# Patient Record
Sex: Male | Born: 1935 | Race: White | Hispanic: No | Smoking: Former smoker
Health system: Southern US, Community
[De-identification: ages and names within clinical notes are randomized; demographics above are authoritative.]

## PROBLEM LIST (undated history)

## (undated) DIAGNOSIS — Z992 Dependence on renal dialysis: Secondary | ICD-10-CM

## (undated) DIAGNOSIS — I739 Peripheral vascular disease, unspecified: Secondary | ICD-10-CM

## (undated) DIAGNOSIS — I10 Essential (primary) hypertension: Secondary | ICD-10-CM

## (undated) DIAGNOSIS — N189 Chronic kidney disease, unspecified: Secondary | ICD-10-CM

## (undated) DIAGNOSIS — C801 Malignant (primary) neoplasm, unspecified: Secondary | ICD-10-CM

## (undated) DIAGNOSIS — I714 Abdominal aortic aneurysm, without rupture: Secondary | ICD-10-CM

## (undated) DIAGNOSIS — E785 Hyperlipidemia, unspecified: Secondary | ICD-10-CM

## (undated) DIAGNOSIS — Z86718 Personal history of other venous thrombosis and embolism: Secondary | ICD-10-CM

## (undated) DIAGNOSIS — M1990 Unspecified osteoarthritis, unspecified site: Secondary | ICD-10-CM

## (undated) HISTORY — DX: Hyperlipidemia, unspecified: E78.5

## (undated) HISTORY — DX: Essential (primary) hypertension: I10

## (undated) HISTORY — DX: Chronic kidney disease, unspecified: N18.9

## (undated) HISTORY — PX: THROMBECTOMY / ARTERIOVENOUS GRAFT REVISION: SUR1351

## (undated) HISTORY — DX: Peripheral vascular disease, unspecified: I73.9

## (undated) HISTORY — DX: Personal history of other venous thrombosis and embolism: Z86.718

## (undated) HISTORY — DX: Malignant (primary) neoplasm, unspecified: C80.1

## (undated) HISTORY — DX: Abdominal aortic aneurysm, without rupture: I71.4

## (undated) HISTORY — DX: Unspecified osteoarthritis, unspecified site: M19.90

---

## 2007-10-02 DIAGNOSIS — I714 Abdominal aortic aneurysm, without rupture, unspecified: Secondary | ICD-10-CM

## 2007-10-02 HISTORY — DX: Abdominal aortic aneurysm, without rupture, unspecified: I71.40

## 2007-10-02 HISTORY — DX: Abdominal aortic aneurysm, without rupture: I71.4

## 2008-03-30 ENCOUNTER — Inpatient Hospital Stay (HOSPITAL_COMMUNITY): Admission: EM | Admit: 2008-03-30 | Discharge: 2008-04-02 | Payer: Self-pay | Admitting: Emergency Medicine

## 2008-03-30 ENCOUNTER — Ambulatory Visit: Payer: Self-pay | Admitting: Surgery

## 2008-03-30 HISTORY — PX: ABDOMINAL AORTIC ANEURYSM REPAIR: SHX42

## 2008-04-12 ENCOUNTER — Ambulatory Visit: Payer: Self-pay | Admitting: Surgery

## 2008-04-19 ENCOUNTER — Encounter: Admission: RE | Admit: 2008-04-19 | Discharge: 2008-04-19 | Payer: Self-pay | Admitting: Surgery

## 2008-04-19 ENCOUNTER — Ambulatory Visit: Payer: Self-pay | Admitting: Surgery

## 2008-05-12 ENCOUNTER — Inpatient Hospital Stay (HOSPITAL_COMMUNITY): Admission: EM | Admit: 2008-05-12 | Discharge: 2008-05-17 | Payer: Self-pay | Admitting: Emergency Medicine

## 2008-05-27 ENCOUNTER — Ambulatory Visit: Payer: Self-pay | Admitting: Surgery

## 2008-06-14 ENCOUNTER — Ambulatory Visit: Payer: Self-pay | Admitting: Surgery

## 2008-06-18 ENCOUNTER — Ambulatory Visit (HOSPITAL_COMMUNITY): Admission: RE | Admit: 2008-06-18 | Discharge: 2008-06-18 | Payer: Self-pay | Admitting: Surgery

## 2008-06-23 ENCOUNTER — Ambulatory Visit: Payer: Self-pay | Admitting: Vascular Surgery

## 2008-06-23 ENCOUNTER — Ambulatory Visit (HOSPITAL_COMMUNITY): Admission: RE | Admit: 2008-06-23 | Discharge: 2008-06-23 | Payer: Self-pay | Admitting: Surgery

## 2008-07-12 ENCOUNTER — Ambulatory Visit: Payer: Self-pay | Admitting: Surgery

## 2008-07-13 ENCOUNTER — Ambulatory Visit (HOSPITAL_COMMUNITY): Admission: RE | Admit: 2008-07-13 | Discharge: 2008-07-13 | Payer: Self-pay | Admitting: Surgery

## 2008-07-19 ENCOUNTER — Encounter: Admission: RE | Admit: 2008-07-19 | Discharge: 2008-07-19 | Payer: Self-pay | Admitting: Surgery

## 2008-07-19 ENCOUNTER — Ambulatory Visit: Payer: Self-pay | Admitting: Surgery

## 2008-08-04 ENCOUNTER — Encounter: Admission: RE | Admit: 2008-08-04 | Discharge: 2008-08-04 | Payer: Self-pay | Admitting: Interventional Cardiology

## 2008-08-06 ENCOUNTER — Inpatient Hospital Stay (HOSPITAL_BASED_OUTPATIENT_CLINIC_OR_DEPARTMENT_OTHER): Admission: RE | Admit: 2008-08-06 | Discharge: 2008-08-06 | Payer: Self-pay | Admitting: Interventional Cardiology

## 2008-08-18 ENCOUNTER — Ambulatory Visit: Payer: Self-pay | Admitting: Vascular Surgery

## 2008-08-18 ENCOUNTER — Ambulatory Visit (HOSPITAL_COMMUNITY): Admission: RE | Admit: 2008-08-18 | Discharge: 2008-08-18 | Payer: Self-pay | Admitting: Surgery

## 2008-09-13 ENCOUNTER — Ambulatory Visit: Payer: Self-pay | Admitting: Surgery

## 2008-10-10 ENCOUNTER — Ambulatory Visit: Payer: Self-pay | Admitting: Vascular Surgery

## 2008-10-10 ENCOUNTER — Ambulatory Visit (HOSPITAL_COMMUNITY): Admission: EM | Admit: 2008-10-10 | Discharge: 2008-10-10 | Payer: Self-pay | Admitting: *Deleted

## 2008-10-25 ENCOUNTER — Encounter: Admission: RE | Admit: 2008-10-25 | Discharge: 2008-10-25 | Payer: Self-pay | Admitting: Surgery

## 2008-10-25 ENCOUNTER — Ambulatory Visit: Payer: Self-pay | Admitting: Surgery

## 2009-01-31 ENCOUNTER — Encounter: Admission: RE | Admit: 2009-01-31 | Discharge: 2009-01-31 | Payer: Self-pay | Admitting: Gastroenterology

## 2009-04-25 ENCOUNTER — Ambulatory Visit: Payer: Self-pay | Admitting: Surgery

## 2009-04-25 ENCOUNTER — Encounter: Admission: RE | Admit: 2009-04-25 | Discharge: 2009-04-25 | Payer: Self-pay | Admitting: Surgery

## 2009-07-17 ENCOUNTER — Ambulatory Visit (HOSPITAL_COMMUNITY): Admission: RE | Admit: 2009-07-17 | Discharge: 2009-07-17 | Payer: Self-pay | Admitting: Nephrology

## 2009-07-24 IMAGING — CR DG FOOT COMPLETE 3+V*R*
3 series · 3 of 3 positions shown · non-contrast
Comparison: None

CLINICAL DATA: Fall, pain great toe

RIGHT FOOT COMPLETE - 3+ VIEW

[AP]
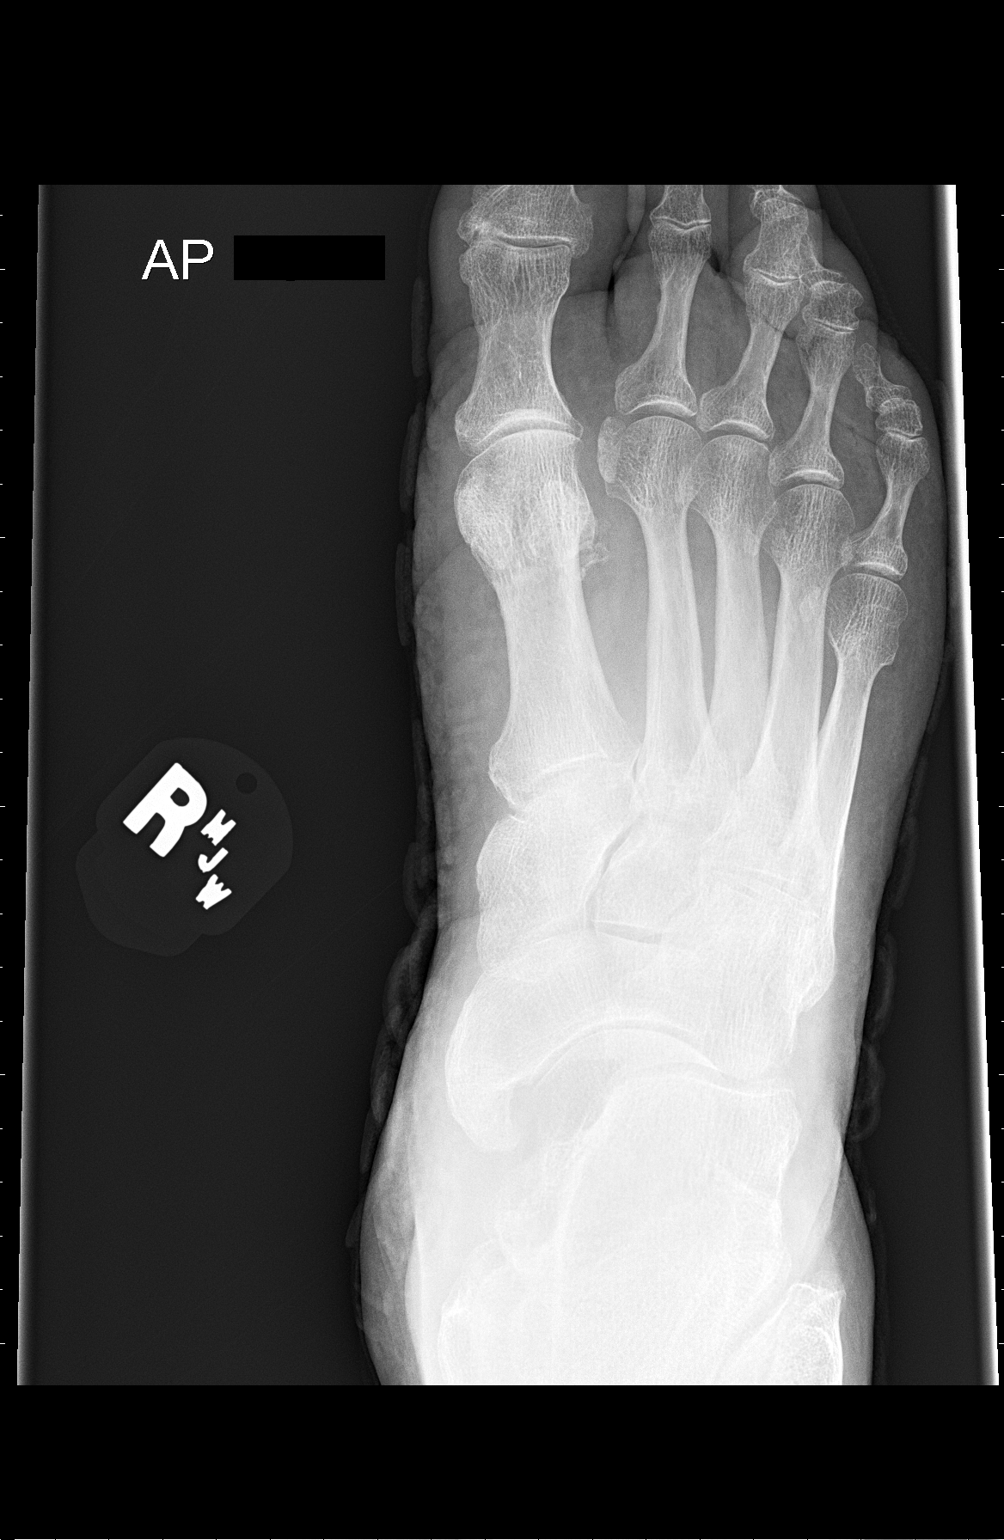

[foot obl]
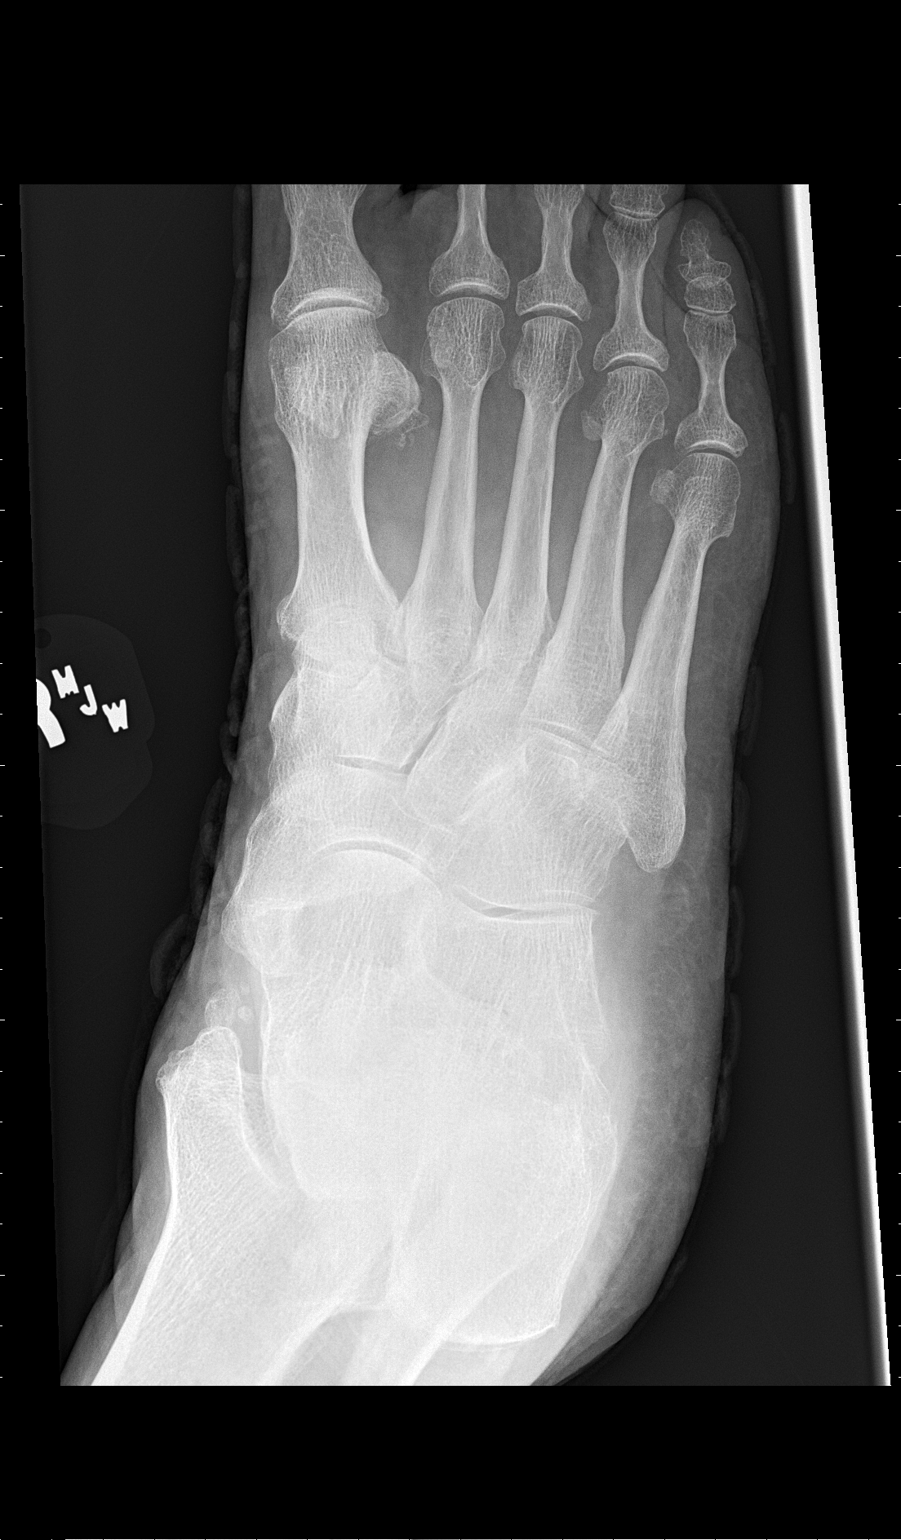

[foot lat]
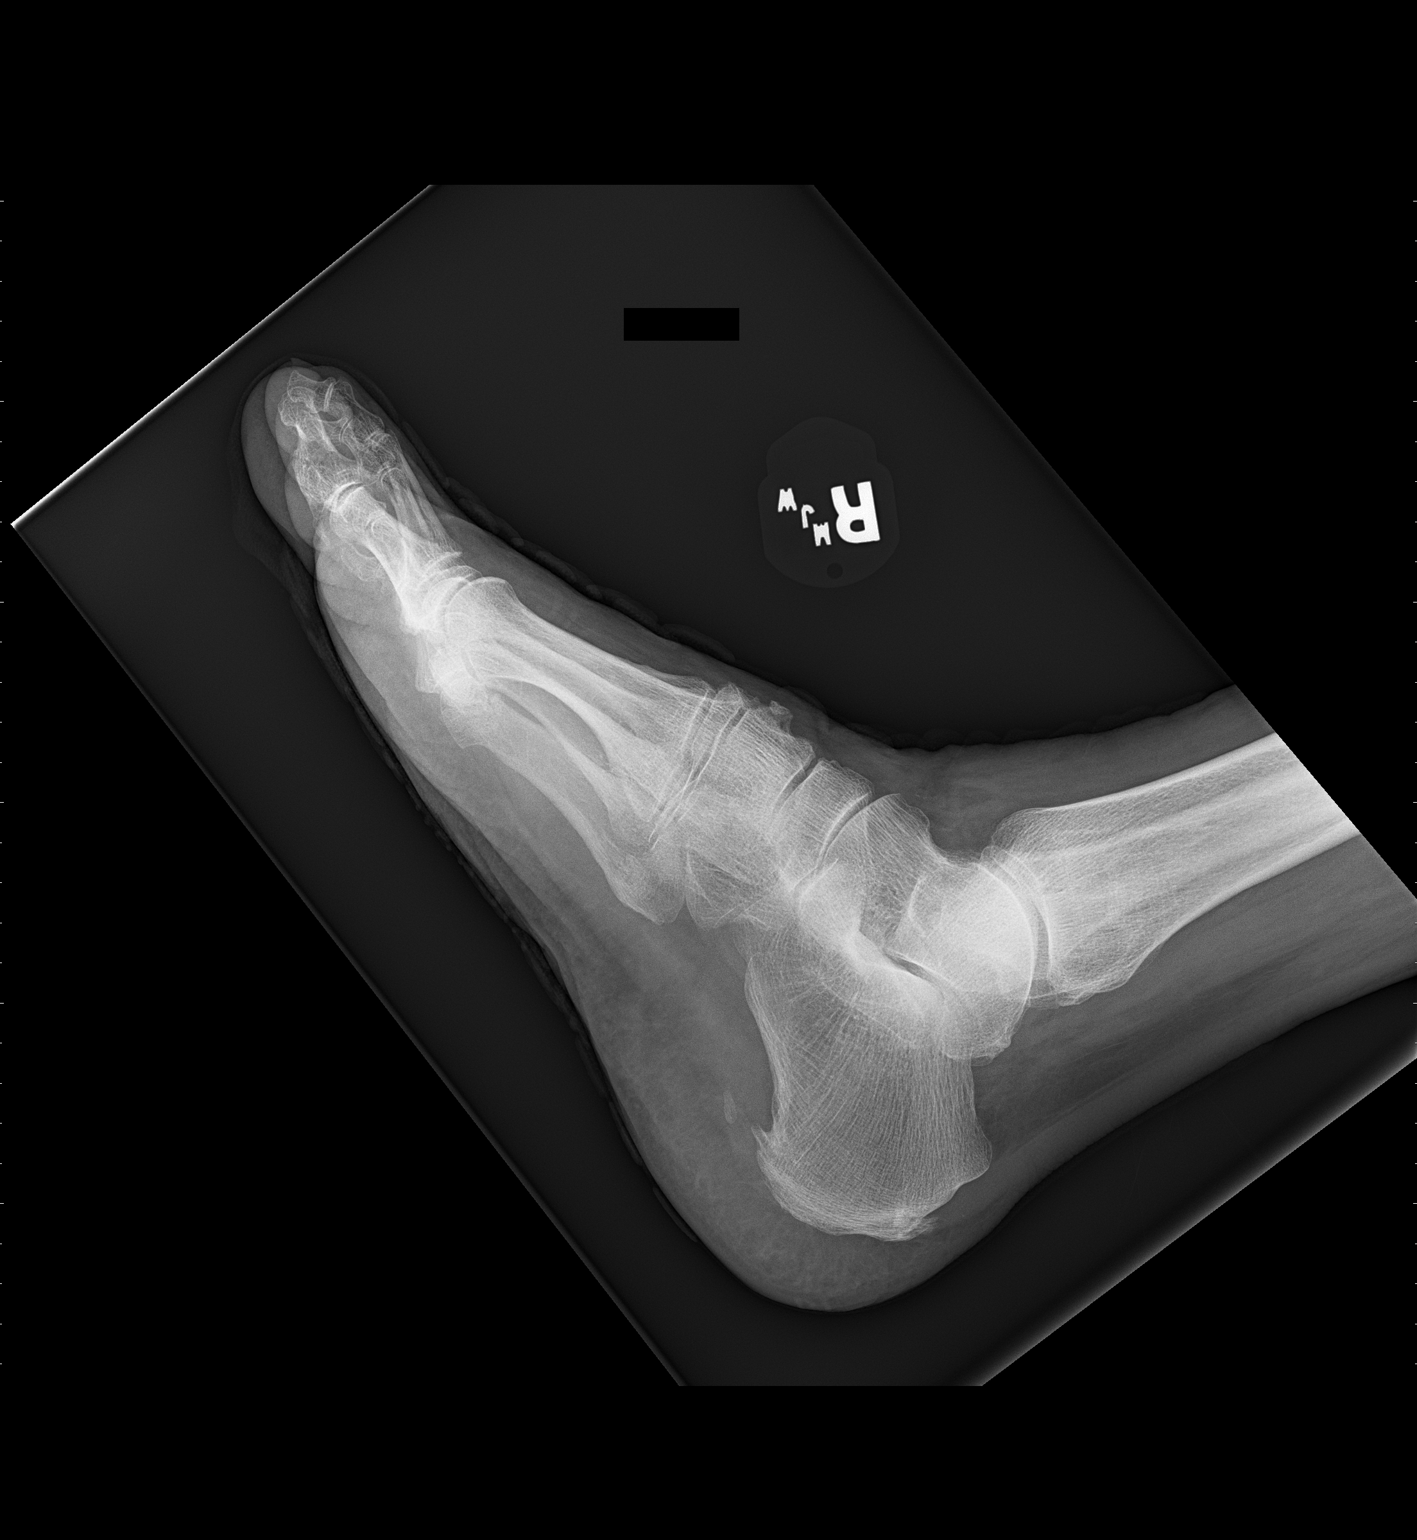

[3 of 3 positions shown; findings below may reference images not displayed]

FINDINGS: Negative for fracture or dislocation.  No erosions are
identified.  There is mild degenerative change in the first MTP and
first interphalangeal joints. There is degenerative spurring
involving the calcaneus.
IMPRESSION: No acute bony abnormality and negative for fracture.

## 2009-07-24 IMAGING — CR DG CHEST 1V PORT
1 series · 1 of 1 positions shown · non-contrast
Comparison: 03/30/2008.

CLINICAL DATA: Abdominal aortic aneurysm.

PORTABLE CHEST - 1 VIEW

[AP]
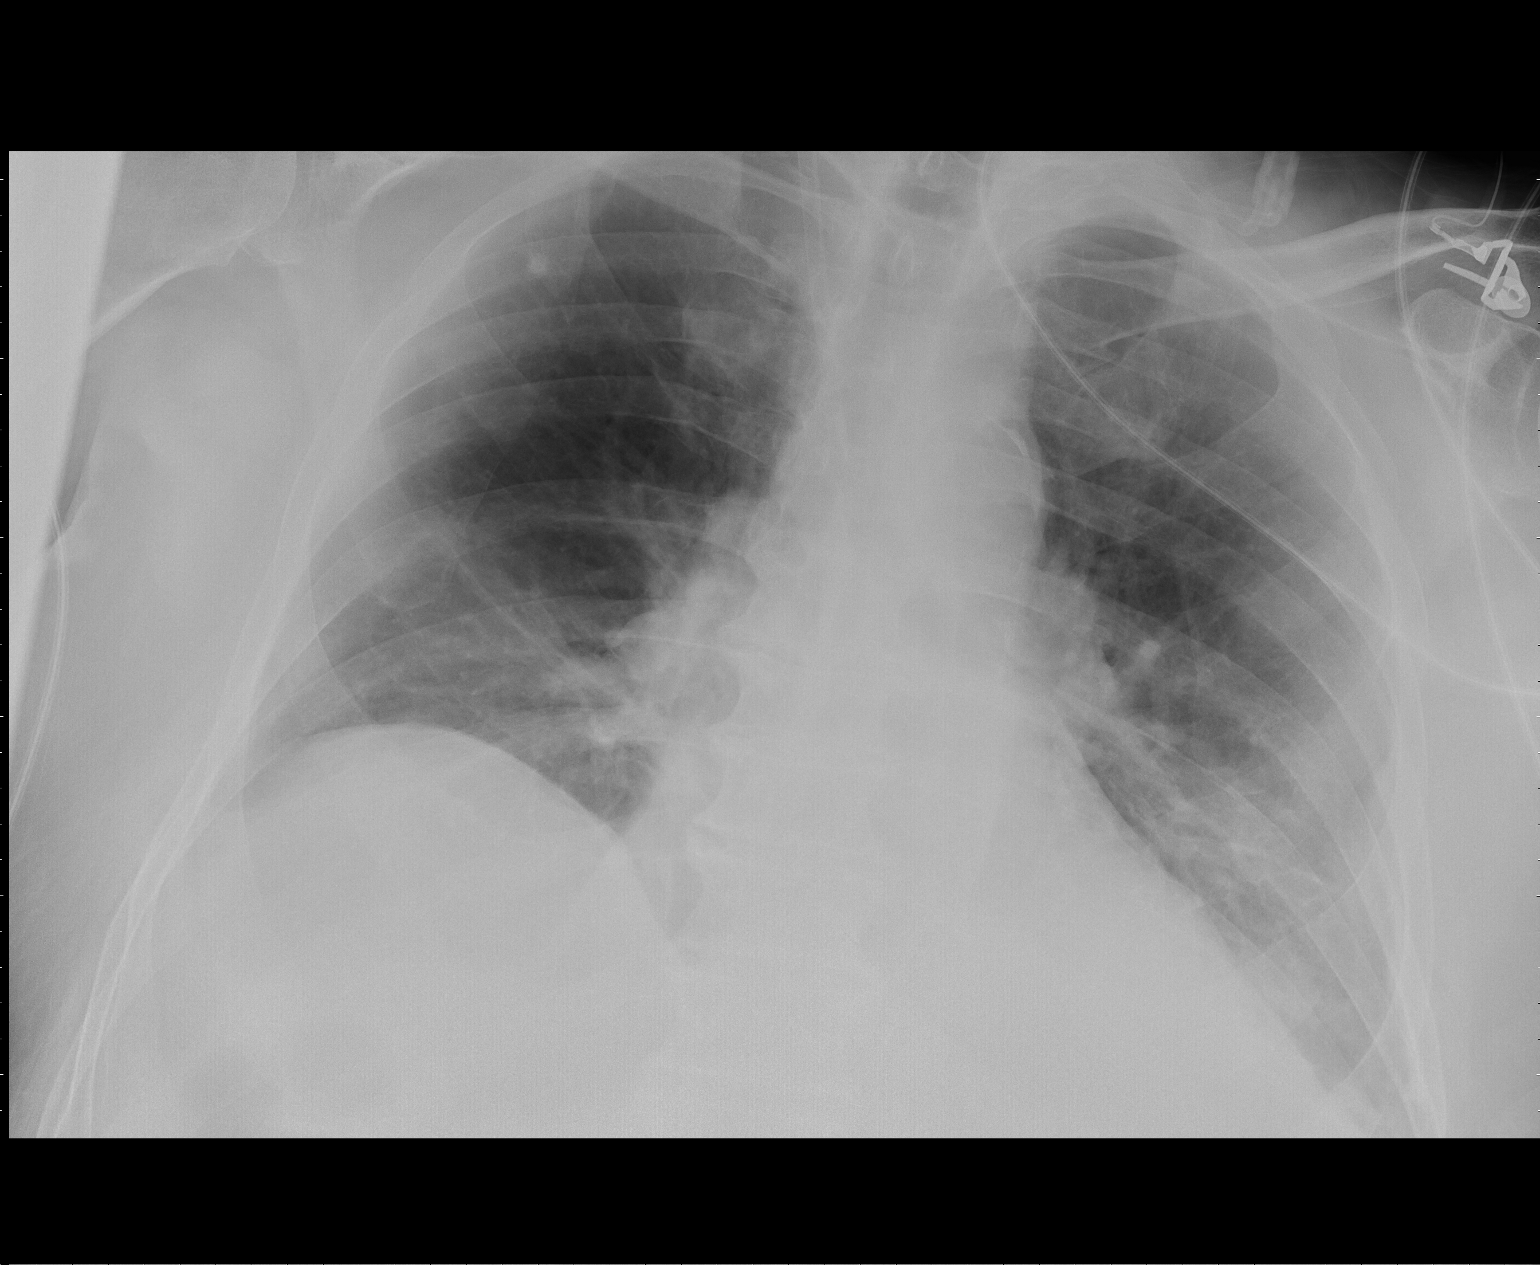

[1 of 1 positions shown; findings below may reference images not displayed]

FINDINGS: The Swan-Ganz catheter has been removed.  The right IJ
sheath remains.  Lung volumes are decreased.  There is significant
interval increase in left basilar airspace disease.  Minimal right
basilar atelectasis is noted.  The upper lung fields are stable.
IMPRESSION: 1.  Status post removal of Swan-Ganz catheter.
2.  Interval development of left basilar airspace disease.  While
this most likely represents atelectasis, early infection is not
excluded.
3.  Minimal right basilar atelectasis.

## 2009-10-19 ENCOUNTER — Ambulatory Visit: Payer: Self-pay | Admitting: Surgery

## 2009-10-21 ENCOUNTER — Ambulatory Visit: Payer: Self-pay | Admitting: Surgery

## 2009-10-21 ENCOUNTER — Emergency Department (HOSPITAL_COMMUNITY): Admission: EM | Admit: 2009-10-21 | Discharge: 2009-10-21 | Payer: Self-pay | Admitting: Emergency Medicine

## 2010-04-24 ENCOUNTER — Ambulatory Visit: Payer: Self-pay | Admitting: Surgery

## 2010-04-24 ENCOUNTER — Encounter: Admission: RE | Admit: 2010-04-24 | Discharge: 2010-04-24 | Payer: Self-pay | Admitting: Surgery

## 2010-10-22 ENCOUNTER — Encounter: Payer: Self-pay | Admitting: Internal Medicine

## 2010-10-22 ENCOUNTER — Encounter: Payer: Self-pay | Admitting: Surgery

## 2010-10-22 ENCOUNTER — Encounter: Payer: Self-pay | Admitting: Nephrology

## 2010-10-23 ENCOUNTER — Ambulatory Visit: Admit: 2010-10-23 | Payer: Self-pay | Admitting: Surgery

## 2010-12-17 LAB — POCT I-STAT, CHEM 8
Chloride: 99 mEq/L (ref 96–112)
HCT: 36 % — ABNORMAL LOW (ref 39.0–52.0)
Hemoglobin: 12.2 g/dL — ABNORMAL LOW (ref 13.0–17.0)
Potassium: 3.9 mEq/L (ref 3.5–5.1)
Sodium: 132 mEq/L — ABNORMAL LOW (ref 135–145)

## 2010-12-17 LAB — DIFFERENTIAL
Basophils Absolute: 0 10*3/uL (ref 0.0–0.1)
Lymphocytes Relative: 11 % — ABNORMAL LOW (ref 12–46)
Lymphs Abs: 1.1 10*3/uL (ref 0.7–4.0)
Neutro Abs: 7.7 10*3/uL (ref 1.7–7.7)

## 2010-12-17 LAB — CBC
Hemoglobin: 12.4 g/dL — ABNORMAL LOW (ref 13.0–17.0)
Platelets: 221 10*3/uL (ref 150–400)
RDW: 13.1 % (ref 11.5–15.5)
WBC: 10 10*3/uL (ref 4.0–10.5)

## 2010-12-17 LAB — PROTIME-INR: Prothrombin Time: 23.2 seconds — ABNORMAL HIGH (ref 11.6–15.2)

## 2011-01-04 LAB — POTASSIUM: Potassium: 4 mEq/L (ref 3.5–5.1)

## 2011-01-11 ENCOUNTER — Other Ambulatory Visit (HOSPITAL_COMMUNITY): Payer: Self-pay | Admitting: Nephrology

## 2011-01-11 DIAGNOSIS — N186 End stage renal disease: Secondary | ICD-10-CM

## 2011-01-15 LAB — POCT I-STAT, CHEM 8
HCT: 44 % (ref 39.0–52.0)
Hemoglobin: 15 g/dL (ref 13.0–17.0)
Potassium: 4.5 mEq/L (ref 3.5–5.1)
Sodium: 137 mEq/L (ref 135–145)

## 2011-01-24 ENCOUNTER — Ambulatory Visit (HOSPITAL_COMMUNITY): Admission: RE | Admit: 2011-01-24 | Payer: Self-pay | Source: Ambulatory Visit

## 2011-01-26 ENCOUNTER — Other Ambulatory Visit (HOSPITAL_COMMUNITY): Payer: Self-pay | Admitting: Nephrology

## 2011-01-26 ENCOUNTER — Ambulatory Visit (HOSPITAL_COMMUNITY)
Admission: RE | Admit: 2011-01-26 | Discharge: 2011-01-26 | Disposition: A | Discharge status: Home / Self Care | Payer: Medicare Other | Source: Ambulatory Visit | Attending: Nephrology | Admitting: Nephrology

## 2011-01-26 DIAGNOSIS — N186 End stage renal disease: Secondary | ICD-10-CM

## 2011-01-26 DIAGNOSIS — K219 Gastro-esophageal reflux disease without esophagitis: Secondary | ICD-10-CM | POA: Insufficient documentation

## 2011-01-26 DIAGNOSIS — I120 Hypertensive chronic kidney disease with stage 5 chronic kidney disease or end stage renal disease: Secondary | ICD-10-CM | POA: Insufficient documentation

## 2011-01-26 DIAGNOSIS — Y849 Medical procedure, unspecified as the cause of abnormal reaction of the patient, or of later complication, without mention of misadventure at the time of the procedure: Secondary | ICD-10-CM | POA: Insufficient documentation

## 2011-01-26 DIAGNOSIS — I2510 Atherosclerotic heart disease of native coronary artery without angina pectoris: Secondary | ICD-10-CM | POA: Insufficient documentation

## 2011-01-26 DIAGNOSIS — T82898A Other specified complication of vascular prosthetic devices, implants and grafts, initial encounter: Secondary | ICD-10-CM | POA: Insufficient documentation

## 2011-01-26 DIAGNOSIS — I4891 Unspecified atrial fibrillation: Secondary | ICD-10-CM | POA: Insufficient documentation

## 2011-01-26 MED ORDER — IOHEXOL 300 MG/ML  SOLN
100.0000 mL | Freq: Once | INTRAMUSCULAR | Status: AC | PRN
  Administered 2011-01-26: 50 mL via INTRAVENOUS

## 2011-02-13 NOTE — Discharge Summary (Signed)
NAMEJAXX, [name]             ACCOUNT NO.:  [id number]   MEDICAL RECORD NO.:  [id number]          PATIENT TYPE:  INP   LOCATION:  2039                         FACILITY:  [facility]   PHYSICIAN:  Juleen China IV, MDDATE OF BIRTH:  [birth year]   DATE OF ADMISSION:  03/30/2008  DATE OF DISCHARGE:  04/02/2008                               DISCHARGE SUMMARY   Patient of Dr. [name], MD   DISCHARGE DIAGNOSES:  1. Ruptured abdominal aortic aneurysm.  2. Hypertension.  3. Status post prostatectomy.  4. Dyslipidemia.   PROCEDURE PERFORMED:  Endovascular repair of ruptured abdominal aortic  aneurysm by Dr. Myra Gianotti on March 30, 2008.   DISCHARGE MEDICATIONS:  1. Zetia 10 mg p.o. daily.  2. Atenolol 50 mg p.o. daily.  3. Advicor 500/20 p.o. daily.  4. Omeprazole 20 mg p.o. daily.  5. Fish oil 1000 mg p.o. daily.  6. Percocet 5/325 one p.o. q.4 h. p.r.n. pain, total number of 30 were      given.   CONDITION ON DISCHARGE:  Stable, improving.   DISPOSITION:  He is being discharged home in stable condition with his  wounds healing well.  He is instructed to clean his incision with soap  and water.  He is instructed to increase his activity slowly.  He may  shower.  He should not lift for 4 weeks or drive for 2 weeks.  He is to  observe his wounds for increasing pain, fever greater than 101, redness,  drainage from incision, persistent nausea and vomiting.  He is to return  to see Dr. Myra Gianotti in 2 weeks with a CTA of the abdomen and pelvis with  stent graft protocol and ABIs.  Brief identifying statement of complete  details, please refer the typed history and physical.  Briefly, this  gentleman presented to the emergency department and was found to have a  ruptured abdominal aortic aneurysm.  He was evaluated by Dr. Myra Gianotti who  recommended emergency repair of the aneurysm by endovascular means.  He  was informed of the risks and benefits of the procedure and after  careful  consideration, he elected to proceed with surgery.   HOSPITAL COURSE:  Preoperative workup was completed on an emergent  basis.  He was taken to the operating room and underwent the  aforementioned repair of his abdominal aortic aneurysm.  Using a main  body [employer] with the primary a right 31 x 14 x 13 with an  ipsilateral iliac extension excluder 16 x 9.5, the contralateral leg was  a Biomedical scientist 20 x 9.5.  For complete details, please refer the typed  operative report.  The procedure was without complications.  He was  returned to Intensive Care Unit in critical but hemodynamically stable  condition.  Following morning, he was hypotensive.  His H&H was  decreased with a hemoglobin of 21.  He received 3 units of packed red  blood cells and underwent CT scan which did not show any increase in the  size of his hematoma.  His creatinine was slightly elevated.  We were  able to  transfer him to a bed on a surgical convalescent floor on the  second postoperative day.  His creatinine was coming down.  This was  followed for another 24 hours.  His creatinine at discharge was 1.32 and  still decreasing.  Following the third postoperative day, he was walking  and improving physical therapy.  He was completing his ADLs without  assistance.  He was desirous of discharge.  His wounds were healing  well.  His lower extremities were warm and well perfused and he was felt  stable and was discharged home.   Prior to his discharge, his family did ask that he receive a urinalysis.  This was essentially clear.      [name], PA      V. Charlena Cross, MD  Electronically Signed    KEL/MEDQ  D:  04/02/2008  T:  04/03/2008  Job:  [id number]   cc:   [name], MD

## 2011-02-13 NOTE — Assessment & Plan Note (Signed)
OFFICE VISIT   [name], [name]  DOB:  09/26/1933                                       06/14/2008  CHART#:[id number]   REASON FOR VISIT:  Evaluate for dialysis access.   HISTORY:  This is a 77 year old gentleman who is status post  endovascular repair of ruptured abdominal aortic aneurysm.  Approximately 1 month following his aneurysm repair, he was found to  have elevated creatinine.  This was around the time of starting on  angiotensin receptor blocker.  This was stopped.  However, his  creatinine continue to increase.  The patient also developed mottling of  both feet as well as his abdominal wall consistent with a potential  embolic process, which could have also affected his kidneys.  Regardless, the patient's creatinine has continued to rise, and he is  nearing the point requiring dialysis.  He was vein mapped today and did  not have adequate caliber vein.  He is right handed and, therefore, I am  scheduling him to undergo a left forearm AV Gore-Tex graft.  I discussed  the risks and benefits of the procedure with him, including the risk of  steal, infection, and future revisions.  He has been scheduled for left  forearm graft Friday, September 18.   [name], MD  Electronically Signed   VWB/MEDQ  D:  06/14/2008  T:  06/16/2008  Job:  [id number]   cc:   Wilber Bihari. Caryn Section, M.D.

## 2011-02-13 NOTE — Op Note (Signed)
NAMELINDY, PENNISI             ACCOUNT NO.:  [id number]   MEDICAL RECORD NO.:  [id number]          PATIENT TYPE:  INP   LOCATION:  2308                         FACILITY:  [facility]   PHYSICIAN:  Juleen China IV, MDDATE OF BIRTH:  [birth year]   DATE OF PROCEDURE:  03/30/2008  DATE OF DISCHARGE:                               OPERATIVE REPORT   PREOPERATIVE DIAGNOSIS:  Ruptured abdominal aortic aneurysm.   POSTOPERATIVE DIAGNOSIS:  Ruptured abdominal aortic aneurysm.   PROCEDURES PERFORMED:  1. Endovascular exclusion of ruptured abdominal aortic aneurysm.  2. Bilateral common femoral artery exposure.  3. Catheter in aorta x2.  4. Abdominal aortogram.  5. Distal extension.   TYPE OF ANESTHESIA:  Local.   SURGEON:  1. Charlena Cross, MD   ASSISTANT:  Larina Earthly, MD.   BLOOD LOSS:  200 mL.   FINDINGS:  Complete exclusion.   DEVICES USED:  Main body is a Biomedical scientist, primary right, 31 x 14 x  13.  Ipsilateral iliac extension is a Biomedical scientist, 16 x 9.5.   The contralateral leg (left) is a Biomedical scientist, 20 x 9.5.   INDICATIONS:  This is a 77 year old gentleman who presented to the  emergency department with 1-week history of pain, which has increased in  severity as well as been associated with passing out and nausea.  CT  scan confirmed ruptured abdominal aortic aneurysm.  The patient was  taken emergently to the operating room.   PROCEDURE:  The patient was identified in the holding area and taken to  room 9, and he was placed supine on the table.  Bilateral groins and the  abdomen were prepped and draped in the standard sterile fashion.  A time-  out was called, and antibiotics were given.  Local 1% was used for  anesthesia.  Bilateral oblique incisions were made above the groin  crease, but below the inguinal ligament.  Dr. Arbie Cookey performed cutdown of  the right groin and simultaneously at the left groin.  Please see his  dictation for that portion of the  procedure.  Cautery was used to  dissect through the subcutaneous tissue.  Bilateral femoral arteries  were exposed and then encircled with vessel loops and mobilized  proximally and distally.  Each femoral artery was accessed with an 18-  gauge needle, and 0.035 wires were advanced into the aorta under  fluoroscopic visualization.  The 8-French sheaths were then placed.  A  marker catheter was placed to the level of L1, and an abdominal  aortogram was obtained to delineate the location of renal arteries.  We  selected to deploy the main body through the right side at the primary  side.  A 20-French sheath was placed, and through the sheath, the main  body was advanced.  This was a Biomedical scientist, 31 x 14 x 13.  It was  positioned just below the renal arteries.  Contrast injections were  repeated to confirm the correct location.  The graft was then deployed.  The graft did migrate distally upon deployment.  This is at the level of  a calcified  shelf in the aorta.  Next, the contralateral gate was  cannulated with a Kumpe catheter and Glidewire.  A marker Omni flush  catheter was advanced over the wire into the graft and was able to be  rotated confirming successful cannulation.  We then shot a retrograde  pelvic angiogram in an RAO oblique to define the location of the left  hypogastric artery.  We then placed a 16-French sheath over the wire  into the contralateral gate.  Through the sheath, the contralateral leg  was placed.  This was a 20 x 9.5-cm device.  The sheath was withdrawn,  and the contralateral limb was then deployed landing proximal to the  left hypogastric artery.  We then performed a right pelvic angiogram in  an LAO oblique to locate the origin of the right hypogastric artery.  A  distal extension was placed up the ipsilateral side, and this was a Nurse, learning disability 16 x 9.5.  This was deployed landing just proximal to the  hypogastric artery on the right.  Next, a Coda balloon  was used to mold  the top of the graft as well as the overlaps of the graft and the distal  end.  A final contrast injection was performed.  This reveals widely  patent renal arteries bilaterally.  Both hypogastric arteries are patent  bilaterally.  There is no evidence of endoleak or contrast within the  ruptured aneurysm sac.  I did heparinize the patient beginning once the  main body device was within the aorta.  Prior to that time, he was not  heparinized.   Once we were satisfied with the exclusion of the aneurysm, sheaths and  wires were removed.  Both femoral arteries were occluded and irrigated  and appropriately flushed.  The arteriotomies were closed with running 5-  0 Prolene.  After the arteriotomy closure, the femoral arteries were  evaluated with Doppler and found to be widely patent.  Next, heparin was  reversed with 25 mg of protamine. The groins were then closed in  multiple layers of 2-0, 3-0, and 4-0 Vicryl.  Dermabond was placed on  the skin.  The patient tolerated the procedure well and was taken to the  recovery room in stable condition.  There were no immediate  complications.           ______________________________  V. Charlena Cross, MD  Electronically Signed     VWB/MEDQ  D:  03/30/2008  T:  03/31/2008  Job:  [id number]

## 2011-02-13 NOTE — Assessment & Plan Note (Signed)
OFFICE VISIT   CLEBERT, WENGER  DOB:  1933-04-02                                       10/25/2008  CHART#:[id number]   REASON FOR VISIT:  Follow-up.   HISTORY:  This is a 77 year old gentleman who is status post  endovascular repair of a ruptured abdominal aortic aneurysm performed on  March 30, 2008.  Following his procedure, he required initiation of  dialysis.  He is currently dialyzing through a left forearm AV Gore-Tex  graft which earlier this month had to be revised secondary to  thrombosis.  I have been following the patient for a discoloration of  his feet likely secondary to atheroemboli.  I was very concerned that  the patient could require amputation.  He has undergone arteriogram  performed on October 13 that did not show any reconstructable vascular  lesions.  I was pleasantly surprised to see that his discoloration has  dramatically improved.  The patient comes in today for follow-up of his  CT scan for his aneurysm.  He has no new complaints at this time.   PHYSICAL EXAMINATION:  VITAL SIGNS:  Blood pressure 160/78, his pulse is  60.  ABDOMEN:  Soft.  CARDIOVASCULAR:  Regular rate and rhythm.  EXTREMITIES:  Warm and well-perfused.  He does have an open wound  secondary to toenail removal on his left second toe.  This does not  appear to be grossly infected.   DIAGNOSTIC STUDIES:  CT angiogram was reviewed by myself today.  There  is no evidence of endoleak.  The aneurysm is stable in size.   ASSESSMENT/PLAN:  1. Abdominal aortic aneurysm:  The Endograft remains in good position.      I am concerned that as the aneurysm remodels that he could develop      a proximal leak.  However this in the area of a kink in his aorta      and so I do not feel any prophylactic intervention should be      performed, we will just need to monitor this closely.  He does not      have any evidence of leak at this time.  I am going to have him get      a  repeat CT angiogram in six months and see me following that.  2. Peripheral vascular disease:  The patient does not have any      reconstructable vascular lesions.  I am a little concerned about      his new area on his second toe from his attempted toenail removal.      I told him to keep a close eye on this and if there is any change      to contact me immediately.   [name], MD  Electronically Signed   VWB/MEDQ  D:  10/25/2008  T:  10/26/2008  Job:  [id number]   cc:   Lyn Records, M.D.  Richard F. Caryn Section, M.D.

## 2011-02-13 NOTE — Assessment & Plan Note (Signed)
OFFICE VISIT   PETERSON, MATHEY  DOB:  [birth year]                                       04/12/2008  CHART#:[id number]   REASON FOR VISIT:  His leg pain.   HISTORY:  This is a 77 year old gentleman who presented with a ruptured  abdominal aortic aneurysm on March 30, 2008.  He was treated  endovascularly with a stent graft.  He is complaining of cramping in his  left calf at approximately 80 yards.  He comes in today for further  evaluation.  I had a Duplex today that shows an ankle brachial index of  1.0 on the right and 0.93 on the left.  The patient's symptoms do sound  like true claudication, and therefore I think he needs to have an  exercise study.  I have arranged for this to happen next week.  He also  needs to get carotid duplex which be done next week.  The patient's  original 60-month follow-up was scheduled for next Monday, with a CT  scan.  The patient will come back next Monday and we will go over all of  these test results.  Overall, I think he is doing well.  I will see him  back in 1 week.   [name], MD  Electronically Signed   VWB/MEDQ  D:  04/12/2008  T:  04/13/2008  Job:  [id number]

## 2011-02-13 NOTE — Assessment & Plan Note (Signed)
OFFICE VISIT   ZEDRICK, SPRINGSTEEN  DOB:  1933/03/03                                       07/12/2008  CHART#:[id number]   REASON FOR VISIT:  Evaluate left fifth toe.   HISTORY:  This is a 77 year old gentleman who underwent endovascular  repair of a ruptured abdominal aortic aneurysm.  This was done on  03/30/2008.  He did well initially, however, approximately 4-6 weeks  following his repair he suffered worsening renal function.  It is  unclear whether this was due to medication or to IV contrast.  Regardless he is now on permanent dialysis.  I recently placed a left  forearm AV Gore-Tex graft on 06/23/2008.  The patient has had mottling  of his lower extremities as well as eosinophilia.  He now comes in with  several day history of pain and ulcer on his left fifth toe.  He was  started on antibiotics, Keflex approximately 5 days ago.  He is not  having any fevers..   PHYSICAL EXAMINATION:  Vital signs:  Blood pressure is 135/62, pulse 59.  General:  He is well-appearing, in no distress.  Abdomen:  Soft.  Extremities:  Reveal palpable pulses but ulcer on the left fifth toe  with some associated erythema.   Lower extremity ultrasound was performed today that shows an ankle  brachial index of 1.1 on the right and 1.0 on the left.  He has biphasic  pedal signals on the left and triphasic on the right.   ASSESSMENT AND PLAN:  Left foot ulcer.   We now have more options given the patient is on dialysis.  I think he  needs to have an arteriogram to further evaluate the disease within his  left leg.  Due to his stent graft should any endovascular option be  entertained it would need to be performed from an antegrade approach.  I  plan on studying the patient as a diagnostic study on Tuesday, October  13.  I will access the right leg and study both legs.  If an  intervention is necessary at that time we would perform an antegrade  access.  We will need to  rearrange the patient's dialysis schedule so  that this can be taken care of expeditiously.   [name], MD  Electronically Signed   VWB/MEDQ  D:  07/12/2008  T:  07/13/2008  Job:  [id number]   cc:   [facility]

## 2011-02-13 NOTE — Op Note (Signed)
[name], [name]             ACCOUNT NO.:  [id number]   MEDICAL RECORD NO.:  [id number]          PATIENT TYPE:  AMB   LOCATION:  SDS                          FACILITY:  [facility]   PHYSICIAN:  Juleen China IV, MDDATE OF BIRTH:  Jun 08, 1933   DATE OF PROCEDURE:  07/13/2008  DATE OF DISCHARGE:  07/13/2008                               OPERATIVE REPORT   PREOPERATIVE DIAGNOSIS:  Left leg ulcer.   POSTOPERATIVE DIAGNOSIS:  Left leg ulcer.   PROCEDURES:  1. Ultrasound access, right common femoral artery.  2. Abdominal aortogram.  3. Catheter in aorta x1.  4. Bilateral lower extremity runoff.   PROCEDURE IN DETAIL:  The patient was identified in the holding area and  taken to room 8.  He was placed supine on the table.  Bilateral groins  were prepped and draped in the standard sterile fashion.  A time-out was  called.  The right common femoral artery was evaluated with ultrasound  and found to be widely patent.  This was accessed with an 18-gauge  needle on ultrasound guidance.  An 0.035 Bentson wire was advanced into  the aorta under fluoroscopic visualization and 5-French sheath was  placed.  Omniflush catheter was placed to the level of L1.  Abdominal  aortogram was obtained.  Next, a Motorjeme catheter was used to select  the left limb of his stent graft and left leg runoff was obtained.  Right leg runoff was obtained via a retrograde sheath injection.   FINDINGS:  Aortogram:  Mild ectasia of the suprarenal abdominal aorta.  There are single renal arteries bilaterally which are patent without  significant stenoses.  A stent graft was visualized within the  infrarenal abdominal aorta.  The stent graft appears to have migrated  distally.  However, it does appear to seal the aneurysm.  There was no  contrast visualized within the aneurysm sac.  Both limbs of the stent  graft are widely patent.  Both external and internal iliac arteries are  widely patent.   Left lower  extremity:  The left common femoral artery was visualized and  is widely patent.  Profunda femoral artery is widely patent.  The  superficial femoral artery is widely patent.  The popliteal artery is  widely patent.  The patient has a single-vessel runoff via the peroneal  artery.  There is mild disease within the peroneal artery.  There is  reconstitution of the posterior tibial artery at the level of the ankle.  Posterior tibial artery is diminutive as it goes out onto the foot.   Right lower extremity:  Right common femoral is widely patent.  The  right profunda femoral artery is widely patent.  There is mild diffuse  disease throughout the right superficial femoral artery with no areas of  hemodynamically significant stenosis.  The popliteal artery is widely  patent.  There is a 30% stenosis within the distal popliteal artery.  Dominant runoff is via the posterior tibial artery.   Once the above images were obtained, a decision made to terminate the  procedure.  Catheters and wires were removed.  The patient  will be taken  to the holding area for a sheath pull.   IMPRESSION:  1. Slight distal migration of the stent graft.  2. Single-vessel runoff via the posterior tibial artery on the right.  3. Single-vessel runoff on the left via the peroneal artery, which      constitutes the posterior tibial artery at the ankle.           ______________________________  V. Charlena Cross, MD  Electronically Signed     VWB/MEDQ  D:  07/13/2008  T:  07/14/2008  Job:  [id number]

## 2011-02-13 NOTE — Procedures (Signed)
CEPHALIC VEIN MAPPING   INDICATION:  Preop exam for AV fistula placement.   HISTORY:  Renal disease.   EXAM:    The right cephalic vein is compressible.   Diameter measurements range from 0.1 to 0.34 cm.   The left cephalic vein is compressible.   Diameter measurements range from 0.18 to 0.15 cm.   See attached worksheet for all measurements.   IMPRESSION:  Patent bilateral cephalic veins with diameter measurements as described  above and on the attached work sheet.   ___________________________________________   V. Charlena Cross, M.D.   CH/MEDQ  D:  06/14/2008  T:  06/15/2008  Job:  [id number]

## 2011-02-13 NOTE — Procedures (Signed)
ENDOVASCULAR STENT GRAFT EXAM   INDICATION:  Followup abdominal aortic aneurysm stent graft.   HISTORY:  Endovascular repair of a ruptured abdominal aortic aneurysm on  03/30/2008.                           DUPLEX EVALUATION   AAA Sac Size:                 4.8 CM AP             4.7 CM TRV  Previous Sac Size:            5.2 CM AP             5.0 CM TRV  Evidence of an endoleak?      No                    No   Velocity Criteria:  Proximal Aorta                124 cm/sec  Proximal Stent Graft          103 cm/sec  Main Body Stent Graft-Mid     48 cm/sec  Right Limb-Proximal           70 cm/sec  Right Limb-Distal             70 cm/sec  Left Limb-Proximal            58 cm/sec  Left Limb-Distal              61 cm/sec  Patent Renal Arteries?        Yes                   Yes   IMPRESSION:  1. Patent endograft repair of abdominal aortic aneurysm with no      evidence of leakage or stenosis.  2. Stable abdominal aortic aneurysm diameter measurements.  3. Normal bilateral ankle brachial indices of 1.08 on the right and      1.02 on the left.   ___________________________________________  V. Charlena Cross, MD   CH/MEDQ  D:  10/19/2009  T:  10/19/2009  Job:  [id number]

## 2011-02-13 NOTE — Assessment & Plan Note (Signed)
OFFICE VISIT   [name], FROH  DOB:  Feb 17, 1933                                       09/13/2008  CHART#:[id number]   REASON FOR VISIT:  Followup.   HISTORY:  A 77 year old gentleman who underwent endovascular repair of  ruptured abdominal aneurysm on June 30.  He had issues with renal  failure requiring dialysis a month following his operation.  Now on  dialysis through a left forearm catheter.  He did develop eosinophilia  as well as discoloration in his feet, thought to be secondary to  atheroemboli several months after his operation.  The patient developed  an ulcer on his left toe.  Taken for arteriogram on October 13 and he  did not have any reconstructable vascular lesions.  We elected to  continue with wound care hoping that this would heal.  He did have ankle  brachial index of 1 by Doppler.  He comes in today having daily dressing  changes, routinely.  He is not scheduled to have a CT scan for his  aneurysm until January.   PHYSICAL EXAMINATION:  He is well appearing in no distress.  Cardiovascular:  Regular rate rhythm.  There is a good thrill within his  forearm loop graft.  The lesions on his left toe have nearly healed.  Did debride off a small eschar on the plantar aspect of his fifth toe.  I am very happy with the way his feet look.   ASSESSMENT/PLAN:  1. Status post ruptured aneurysm:  The patient is due to have a CT      scan in January.  He does have evidence of a late type 2 endoleak,      but his aneurysm has been decreasing in size, so I am closely      watching this.  2. Left foot ulcer.  This has made dramatic progress.  I would      recommend continuing with the dressing changes in wound care, as I      am very pleased this has made such good progress.  The patient is      still at risk for requiring amputation.  However, I think he is      dramatically improved from when I last saw him.   [name], MD  Electronically Signed   VWB/MEDQ  D:  09/13/2008  T:  09/14/2008  Job:  [id number]   cc:   Lyn Records, M.D.  Richard F. Caryn Section, M.D.

## 2011-02-13 NOTE — Procedures (Signed)
RENAL ARTERY DUPLEX EVALUATION   INDICATION:  Elevated creatinine, hypertension.  History abdominal  aortic aneurysm repair.   HISTORY:  Diabetes:  No.  Cardiac:  No.  Hypertension:  Yes.  Smoking:  Quit 30 years ago.   RENAL ARTERY DUPLEX FINDINGS:  Aorta-Proximal:  113 cm/s  Aorta-Mid:  143 cm/s  Aorta-Distal:  55 cm/s  Celiac Artery Origin:  133 cm/s  SMA Origin:  49 cm/s                                    RIGHT               LEFT  Renal Artery Origin:             121/23 cm/s         34/10 cm/s  Renal Artery Proximal:           74/18 cm/s          76/13 cm/s  Renal Artery Mid:                24/6 cm/s           23/7 cm/s  Renal Artery Distal:             21/7 cm/s           37/9 cm/s  Hilar Acceleration Time (AT):    m/s2                m/s2  Renal-Aortic Ratio (RAR):        1.1                 0.67  Kidney Size:                     10.4 cm x 5.7 cm    10.9 cm x 5.63 cm  End Diastolic Ratio (EDR):       0.22 to 0.24        0.29  Resistive Index (RI):            0.75                0.83   IMPRESSION:  1. Kidneys are normal with respect to size and shape bilaterally.  2. Resistive indices suggest parenchymal disease bilaterally.  3. Renal to aortic ratio suggests no significant renal artery      occlusive disease.   ___________________________________________  V. Charlena Cross, MD   MC/MEDQ  D:  05/27/2008  T:  05/27/2008  Job:  [id number]

## 2011-02-13 NOTE — Assessment & Plan Note (Signed)
OFFICE VISIT   [name], [name]  DOB:  [birth year]                                       07/19/2008  CHART#:[id number]   REASON FOR VISIT:  Follow up toe ulcer status post endovascular aneurysm  repair.   HISTORY:  This is a 77 year old gentleman who is status post  endovascular repair of a ruptured abdominal aortic aneurysm performed on  March 30, 2008.  Unfortunately, he has suffered from renal failure, now  requiring dialysis.  This was either secondary to medication versus IV  contrast.  I placed a left forearm AV Gore-Tex graft on 06/23/2008.  He  is currently dialyzing through [facility] catheter.  He had been complaining  of mottling of his lower extremities and was found to have eosinophilia,  thus, making the assumption that he has a mottled feet were secondary to  atheroemboli.  I saw him on October 12, he had developed a small ulcer  on his left fifth toe.  He was on antibiotics at that time.  He  underwent arteriogram which was performed October 13 and at that time he  did not have any reconstructable vascular lesions.  He did have disease  within his foot.  It took a significant amount of time for contrast to  reach his foot.  I had an extensive conversation with the family, after  his arteriogram, approximately 45 minutes to an hour, detailing all  options.  We discussed continued observation of the toe with wound care  in hopes that this will heal.  We discussed primary amputation of the  toe with the potential drawback that, the amputation site may not heal,  requiring a more proximal amputation versus a primary proximal  amputation below the knee.  We elected to continue with observation.  He  comes back in today for followup and further evaluation.  He also had a  CT scan to evaluate his aneurysm.  He has nursing home health therapy at  home to perform dressing changes well as physical therapy.  His pain is  unchanged.   PHYSICAL EXAMINATION:   Blood pressure 123/41, pulse is 84, respirations  18.  General:  He is in no acute distress.  The left fifth toe has an  eschar at its base.  There is some minimal amount of erythema.  Extending to the base of the toe.  There is no gross purulence.   DIAGNOSTIC STUDIES:  CT scan performed today which reveals a small late  type 2 endoleak.  The aneurysm has decreased in size from 6.6 cm to 5.6  cm.   ASSESSMENT/PLAN:  1. Abdominal aortic aneurysm.  His CT scan shows evidence of a late      type 2 endoleak.  We will continue to follow this with a CT scan in      3 months.  2. Left toe, we will continue to monitor this.  If the wound looks      worse, we will need to consider amputation as he does not have      reconstructable vascular disease.  Would have to consider at that      time whether a toe amputation would be sufficient versus a proximal      amputation.   [name], MD  Electronically Signed   VWB/MEDQ  D:  07/19/2008  T:  07/20/2008  Job:  [id number]   cc:   Lyn Records, M.D.  Richard F. Caryn Section, M.D.

## 2011-02-13 NOTE — Procedures (Signed)
CAROTID DUPLEX EXAM   INDICATION:  Postop aortic aneurysm repair with questionable carotid  disease.   HISTORY:  Diabetes:  No.  Cardiac:  No.  Hypertension:  Yes.  Smoking:  Quit 25 years ago.  Previous Surgery:  Aortic aneurysm rupture and repair.  CV History:  Amaurosis Fugax No, Paresthesias No, Hemiparesis No.                                       RIGHT             LEFT  Brachial systolic pressure:         212  Brachial Doppler waveforms:         Biphasic          Biphasic  Vertebral direction of flow:        Antegrade         Antegrade  DUPLEX VELOCITIES (cm/sec)  CCA peak systolic                   64                49  ECA peak systolic                   65                107  ICA peak systolic                   99                99  ICA end diastolic                   17                13  PLAQUE MORPHOLOGY:                  Heterogenous      Heterogenous  PLAQUE AMOUNT:                      Mild              Mild  PLAQUE LOCATION:                    ICA, ECA          ICA, ECA   IMPRESSION:  1. 20-39% stenosis noted in bilateral internal carotid artery      stenoses.  2. Antegrade bilateral vertebral arteries.   ___________________________________________  V. Charlena Cross, MD   MG/MEDQ  D:  04/19/2008  T:  04/19/2008  Job:  [id number]

## 2011-02-13 NOTE — H&P (Signed)
NAMECOLBI, STAUBS NO.:  [id number]   MEDICAL RECORD NO.:  [id number]          PATIENT TYPE:  EMS   LOCATION:  MAJO                         FACILITY:  [facility]   PHYSICIAN:  Lyn Records, M.D.   DATE OF BIRTH:  1933-02-23   DATE OF ADMISSION:  05/12/2008  DATE OF DISCHARGE:                              HISTORY & PHYSICAL   ADMIT NOTE   REASON FOR ADMISSION:  Hypertension, renal failure.   HISTORY OF PRESENT ILLNESS:  [name] is a pleasant 77 year old  male patient of Dr. Sherilyn Cooter Foley's with recent abdominal aneurysm  rupture, treated on March 30, 2008 with endovascular repair and stent  graft.  The procedure went well without complications, but the following  day he was hypotensive and anemic.  He required 3 units of packed red  blood cells and underwent abdominal CT scan which did not show any  increase in hematoma.  His creatinine elevated after surgery, but was  1.32 at discharge.  During his hospitalization, it was noted that he had  an abnormal EKG without ever having had a cardiovascular history.  He  was seen by Dr. Verdis Foley with [name] who ordered a stress  Cardiolite.  The patient's blood pressure was elevated at 212/94 at that  initial visit on April 28, 2008.  Since his creatinine had normalized, he  was placed on low-dose Benicar/HCT.  BMET  was obtained at today's visit  while he was here in the clinic for a stress test.  Creatinine was found  to be elevated at 5.3 and blood pressure continued be elevated at  200/90.  He is being admitted for the same.   REVIEW OF SYSTEMS:  GENERAL:  He has felt well.  Energy level has been  good.  He has had no fever or chills.  No recent weight gain or loss.  HEENT:  He wears glasses.  No problems with hearing, nasal discharge,  epistaxis, sore throat.  NECK:  No stiffness.  CHEST:  Denies shortness  of breath, cough, wheezing, PND or orthopnea.  CARDIOVASCULAR:  Denies  chest pain, pressure,  palpitations, tachyarrhythmias.  ABDOMEN:  No  nausea, vomiting, abdominal pain, problems with appetite, problems with  stools, no hematochezia or melena.  EXTREMITIES:  Bilateral groin  discomfort due to surgery.  This has improved.  He has had no lower  extremity edema.  Does have leg claudication symptoms after walking  approximately 100 feet.  He has known peripheral vascular disease.  No  peripheral edema.  BACK:  Denies any discomfort.  NEURO:  No numbness,  tingling, headache, seizures, memory loss, vertigo, tremors,  lightheadedness or syncope.   PAST MEDICAL HISTORY:  1. Hypertension, longstanding.  2. Recent abdominal aneurysm rupture with endovascular repair and      stent graft, March 30, 2008, Marc Longest, MD.  3. Renal insufficiency, new onset following above procedure.  4. Hyperlipidemia.  5. Gastroesophageal reflux disease.  6. Prostate cancer, treated with radiation, 1991.  7. Peripheral vascular disease, left worse than right.   PAST SURGICAL HISTORY:  As above.   CURRENT MEDICATIONS:  1. Atenolol 50 mg daily.  2. Advicor 500/20 one a day.  3. Zetia 10 mg daily.  4. Omeprazole 20 mg daily.  5. Fish oil 1200 mg daily.  6. Multivitamin daily.  7. Benicar/HCT 20/12.5 daily.   DRUG ALLERGIES:  'MYCINS.   SOCIAL HISTORY:  Smoked heavily for 25 years, stopped smoking 25 years  ago.  Had 2-3 Scotches per day,  although discontinued this since his  abdominal aortic aneurysm surgery.   FAMILY HISTORY:  Father died at age 62 of old age.  Mother died at age  34 of old age.  A 62 year old sister died of colon cancer.  A 65-year-  old sister with bladder cancer.  A 29 year old sister alive and well.  A  72 year old sister died in an automobile accident.   OBJECTIVE DATA:  VITAL SIGNS:  Weight is 193.4, pulse is 64, blood  pressure 200/90.  GENERAL:  Very pleasant gentleman in no acute distress.  SKIN:  Warm and dry.  HEENT:  Unremarkable.  NECK:  No JVD.  No  adenopathy.  No carotid bruits auscultated.  CARDIAC:  Normal S1-S2 without murmur, rub or click.  LUNGS:  Good excursion.  Clear throughout without rales, wheezing or  rhonchi.  ABDOMEN:  Soft, nondistended.  Bowel sounds present in 4  quadrants.  No abdominal tenderness noted.  EXTREMITIES:  Bilateral incisions from surgery healing nicely without  redness, swelling or drainage.  Lower extremity exam reveals +2  posterior tibial pulses.  There is no edema.  NEURO:  No sensory or motor deficits.  Exam nonfocal.   DIAGNOSTICS:  EKG reveals sinus rhythm with atrial abnormality.  He has  a right bundle branch block as well as leftward axis deviation.  No  evidence of infarct.  Prominent voltage consistent with LVH is noted as  well as a PVC.   LABORATORY DATA:  BMET revealed a glucose 104, BUN 65, creatinine 5.3,  sodium 141, potassium 5.0, chloride 108, CO2 26, calcium 9.8, anion gap  12.0, EGR 10.69.   IMPRESSION:  1. Renal failure.  2. Hypertension, uncontrolled.  3. Recent abdominal aortic aneurysm rupture with endovascular      treatment and stent implantation.  4. Abnormal electrocardiogram.  5. Peripheral vascular disease.  6. Hyperlipidemia.  7. Gastroesophageal reflux disease.   PLAN:  Admit to monitored bed.  Stop Benicar/HCT.  Add amlodipine 5 mg a  day.  Lovenox therapy for DVT prophylaxis.  Renal consult.  Dr. [facility] to follow.      Tamera C. Lewis, N.P.      Lyn Records, M.D.  Electronically Signed    TCL/MEDQ  D:  05/12/2008  T:  05/12/2008  Job:  [id number]

## 2011-02-13 NOTE — Cardiovascular Report (Signed)
NAMEHILTON, SAEPHAN NO.:  [id number]   MEDICAL RECORD NO.:  [id number]          PATIENT TYPE:  OIB   LOCATION:  1962                         FACILITY:  [facility]   PHYSICIAN:  Lyn Records, M.D.   DATE OF BIRTH:  1933/07/06   DATE OF PROCEDURE:  08/06/2008  DATE OF DISCHARGE:  08/06/2008                            CARDIAC CATHETERIZATION   INDICATIONS:  The patient had an abnormal myocardial perfusion study  performed in August following emergency endovascular therapy of a  ruptured abdominal aortic aneurysm.  This study is being done to rule  out severe coronary artery disease/left main disease.   PROCEDURES PERFORMED:  1. Left heart catheterization.  2. Selective coronary angiography.  3. Left ventriculography.   DESCRIPTION:  After informed consent, a 4-French sheath was placed in  the right femoral artery using a modified Seldinger technique.  A 4-  [place] A2 multipurpose catheter was used for hemodynamic recordings,  left ventriculography by hand injection, and right coronary angiography.  We then used a #4 followed by a #5 4-French left Judkins catheter for  left coronary angiography.  We noted that after the single shot taken  with a #4 left Judkins catheter that there was minimal dye staining in  the superior aspect of the ostial left main in a region already laden  with calcification.  We were able to engage the #5 left Judkins catheter  and the left main without pressure damping.  A left main appeared to be  widely patent at most LAO views, but in RAO views, there appeared to be  some eccentric narrowing of the vessel up to 50%.  After multiple views,  the case was terminated and hemostasis achieved with manual compression.   We were careful to perform all catheter exchanges above the patient's  known abdominal endograft.  We will also careful with retrograde  catheterization to hopefully avoid recurrent thromboemboli/atheroembolic  as occurred  after endovascular treatment of his aneurysm with some.  No  immediate complications of this procedure were noted.   RESULTS:  1. Hemodynamic data:      a.     Aortic pressure 145/52.      b.     Left ventricular pressure 147/23.  2. Left ventriculography:  Left ventricle is mildly dilated.  The LV      function is diminished.  EF is in the 35-45% range.  No mitral      regurgitation.  3. Coronary angiography.      a.     Left main coronary:  Calcified.  Eccentric 20-50% narrowing       without catheter damping or ventricularization of pressures.      b.     Left anterior descending coronary:  Segmental 50% narrowing       in the proximal to midvessel.  No significant high-grade       obstruction was noted.  The LAD wraps around the left ventricular       apex.      c.     Circumflex artery:  This is a dominant vessel.  A large  early first obtuse marginal branch contains 30-40% narrowing.  The       distal circumflex gives a PDA and is free of any significant       obstruction.  The first marginal bifurcates on the inferolateral       wall.      d.     Right coronary:  Nondominant and without any significant       obstruction.   CONCLUSION:  1. 25-50% ostial and mid left main depending upon the views evaluated.      Angiographically does not appear to be equivocal or significant      stenosis.  The left main is, however, disease.  2. Moderate mid-LAD disease.  3. Dominant circumflex with wide patency and a nondominant right      coronary.  4. Depressed LV function with EF 35-45%.   PLAN:  Aggressive medical therapy.  No revascularization procedures or  strategy is indicated at this time.      Lyn Records, M.D.  Electronically Signed     HWS/MEDQ  D:  08/06/2008  T:  08/06/2008  Job:  [id number]   cc:   [name], MD  Llana Aliment Deterding, M.D.

## 2011-02-13 NOTE — Op Note (Signed)
NAMESAFAL, HALDERMAN             ACCOUNT NO.:  [id number]   MEDICAL RECORD NO.:  [id number]          PATIENT TYPE:  OBV   LOCATION:  2550                         FACILITY:  [facility]   PHYSICIAN:  Di Kindle. Edilia Bo, M.D.DATE OF BIRTH:  [birth year]   DATE OF PROCEDURE:  10/10/2008  DATE OF DISCHARGE:                               OPERATIVE REPORT   PREOPERATIVE DIAGNOSIS:  Chronic kidney disease with clotted left  forearm arteriovenous graft.   POSTOPERATIVE DIAGNOSIS:  Chronic kidney disease with clotted left  forearm arteriovenous graft.   PROCEDURE:  Thrombectomy and revision of left forearm graft.   SURGEON:  Di Kindle. Edilia Bo, MD   ASSISTANT:  Nurse.   ANESTHESIA:  Local with sedation.   TECHNIQUE:  The patient was taken to the operating room sedated by  Anesthesia.  The left upper extremity was prepped and draped in usual  sterile fashion.  After the skin was infiltrated with 1% lidocaine,  incision over the venous anastomosis was opened and the venous limb of  the graft dissected free.  The patient was heparinized.  The venous limb  of the graft was divided.  Graft thrombectomy was achieved using a #4  Fogarty catheter after the patient was heparinized.  The arterial plug  was clearly retrieved.  There were no problems in pulling the catheter  through the body of the graft.  Venous thrombectomy was then performed  and there was intimal hyperplasia at the venous anastomosis at the side  of the valve.  I therefore dissected higher up on the basilic vein and  the vein here was divided and spatulated.  A new segment of 7-mm PTFE  was brought to the field, spatulated, and sewn end-to-end to the vein  using continuous 6-0 Prolene suture.  At the completion, the new segment  of graft was cut to the appropriate length and anastomosed end-to-end to  the old graft using continuous 6-0 Prolene suture.  At the completion,  there was an excellent thrill in the graft.   Hemostasis was obtained in  the wound, and the wound was closed with deep layer of 3-0 Vicryl and  skin closed with 4-0 Vicryl.  A sterile dressing was applied.  The  patient tolerated procedure well, was transferred to the recovery room  in satisfactory condition.  All needle and sponge counts were correct.      Di Kindle. Edilia Bo, M.D.  Electronically Signed     CSD/MEDQ  D:  10/10/2008  T:  10/10/2008  Job:  [id number]

## 2011-02-13 NOTE — Procedures (Signed)
CAROTID DUPLEX EXAM   INDICATION:  Followup known carotid artery disease.   HISTORY:  Diabetes:  No.  Cardiac:  No.  Hypertension:  Yes.  Smoking:  Previous.  Previous Surgery:  Aortic aneurysm rupture and repair.  CV History:  No.  Amaurosis Fugax No, Paresthesias No, Hemiparesis No                                       RIGHT             LEFT  Brachial systolic pressure:         120               Graft  Brachial Doppler waveforms:         WNL  Vertebral direction of flow:        Antegrade         Antegrade  DUPLEX VELOCITIES (cm/sec)  CCA peak systolic                   73                69  ECA peak systolic                   127               119  ICA peak systolic                   108               84  ICA end diastolic                   24                23  PLAQUE MORPHOLOGY:                  Heterogeneous     Heterogeneous  PLAQUE AMOUNT:                      Mild              Mild  PLAQUE LOCATION:                    ICA, ECA          ICA, ECA   IMPRESSION:  1. 20%-39% stenosis of bilateral internal carotid arteries.  2. Antegrade flow in bilateral vertebrals.         ___________________________________________  V. Charlena Cross, MD   CB/MEDQ  D:  10/21/2009  T:  10/21/2009  Job:  [id number]

## 2011-02-13 NOTE — Assessment & Plan Note (Signed)
OFFICE VISIT   [name], [name]  DOB:  [birth year]                                       04/25/2009  CHART#:[id number]   REASON FOR VISIT:  Follow up aneurysm.   HISTORY:  This is a 77 year old gentleman who initially presented with a  ruptured infrarenal abdominal aortic aneurysm on 03/30/08.  This was  repaired with a stent graft.  He ultimately developed renal failure.  It  is unclear as to the etiology of his renal failure; however, he went on  to require dialysis, for which he is dialyzing through a left forearm  graft.  He also had issues with atheroemboli in both lower extremities.  An arteriogram was performed, which did not show any reconstructable  vascular lesions.  Overall, the patient has recovered quite nicely from  this.  He comes in today for followup of his aneurysm.  He is tolerating  his dialysis.  He is getting to walking several times a day.  His  appetite is improved.  His feet do not bother him as they did before.   PHYSICAL EXAMINATION:  Blood pressure is 167/70.  His pulse is 98.  General:  He is well-appearing in no acute distress.  Cardiovascular:  Regular rate and rhythm.  Abdomen is soft.  Extremities are warm and  well-perfused.  He has a functioning dialysis graft.   DIAGNOSTIC STUDIES:  CT scan shows no evidence of endoleak.  His  aneurysm is decreased in size.  It now measures 5.2 x 5.0 cm, compared  to 5.6 x 5.3 previously.   ASSESSMENT/PLAN:  1. Abdominal aortic aneurysm:  Patient will have an ultrasound in our      office in 6 months to evaluate for endoleak.  If that is negative,      I will not see him for a year.  He will undergo another CT      angiogram in 1 year.  2. End-stage renal disease:  The patient is doing well with his left      forearm Gore-Tex graft.  3. Leg pain:  The patient is able to walk 400 to 500 yards several      times a day before he gets cramps.  Based on his arteriogram which      was  performed within the last 6 months, I do not think that his      cramping is due to vascular insufficiency.  I will have an      ultrasound performed at his next visit to ensure that this is the      case.     [name], MD  Electronically Signed   VWB/MEDQ  D:  04/25/2009  T:  04/26/2009  Job:  [id number]   cc:   Lyn Records, M.D.  Richard F. Caryn Section, M.D.

## 2011-02-13 NOTE — Consult Note (Signed)
[name], [name]             ACCOUNT NO.:  [id number]   MEDICAL RECORD NO.:  [id number]          PATIENT TYPE:  INP   LOCATION:  4705                         FACILITY:  [facility]   PHYSICIAN:  Wilber Bihari. Caryn Section, M.D.   DATE OF BIRTH:  09/10/1933   DATE OF CONSULTATION:  05/12/2008  DATE OF DISCHARGE:                                 CONSULTATION   REFERRING PHYSICIAN:  Dr. Halina [facility].   CHIEF COMPLAINT:  Dr. Katrinka Blazing sent me here.   HISTORY OF PRESENT ILLNESS:  This is a 77 year old white man with  history of hypertension, hyperlipidemia and recent endovascular repair  of an abdominal aortic aneurysm.  He presents with increased creatinine.  He had stress test today at the cardiologist's office.  He had increased  creatinine in the setting of recent repair of abdominal aortic aneurysm  and multiple CTs, and recent initiation of ARB.  The patient also has  history of longstanding hypertension poorly controlled, and  hospitalization for aneurysm repair.  The patient underwent a CT with  contrast on March 30, 2008, which revealed aneurysm.  His creatinine  bumped to 1.7, but on discharge went down to 1.3.  The patient then had  repeat outpatient CT angiogram to further evaluate repair with contrast.  The patient also of note was started a week and a half ago on Benicar  HCT for better control of blood pressure.  The patient denies any  symptoms whatsoever, including no fevers or chills, nausea or vomiting  or fatigue.   ALLERGIES:  Streptomycin possible.   MEDICATIONS:  1. Zetia 10 mg one p.o. daily.  2. Atenolol 50 mg one p.o. daily.  3. Hydrochlorothiazide 500/20 mg one p.o. daily.  4. Omeprazole 20 mg one p.o. daily.  5. Fish oil 1000 mg daily.  6. Multivitamin daily.  7. Benicar HCT daily 20/12.5 started July 29. 2009 and stopped May 12, 2008.   PAST MEDICAL HISTORY:  1. Hypertension for 15+ years; normally, per the patient, in the 170      systolic.  2.  Hyperlipidemia.  3. History of ruptured abdominal aortic aneurysm status post      endovascular repair March 30, 2008.  4. Acute renal failure after procedure.  5. Workup for claudication and peripheral vascular disease.  6. Barrett's esophagus per patient.  7. Prostate cancer status post prostatectomy in the early 90s and      radiation therapy.  8. Residual incontinence, worse since procedure.  9. Gastroesophageal reflux disease.   SOCIAL HISTORY:  The patient lives in [place] alone.  He is a retired  Production designer, theatre/television/film for a [employer] and is currently divorced.  The patient denies having children.  He has a 68-pack year smoking  history, which he quit 20 years ago.  The patient  drinks two to three  scotches per day, but said he stopped this since his surgery.  The  patient denies recreational drug use.  The patientas born in [place],  [place].   FAMILY HISTORY:  The patient has siblings with coronary artery disease,  stroke and  cancer; including melena, colon cancer and bladder cancer.   REVIEW OF SYSTEMS:  Positive for claudication after walking  approximately 80 yards.  Also positive for weakness and increasing  incontinence.  Otherwise negative except as per HPI.   PHYSICAL EXAMINATION:  Temperature 97, pulse 77, respiratory rate 20,  blood pressure 197/89, oxygen 99% on room air, weight 193 pounds prior  to admission.  GENERAL:  In no acute distress.  A verbose Caucasian male who appears  stated age.  HEENT:  Normocephalic, atraumatic.  Pupils equally round and reactive to  light.  No scleral icterus or injection.  Oropharynx clear.  NECK:  Supple without lymphadenopathy.  CARDIOVASCULAR:  Normal S1 and S2, with no murmurs.  Positive for  ectopy.  LUNGS:  Clear to auscultation bilaterally.  No rubs, crackles or  wheezing.  SKIN:  No rash or lesions noted.  No evidence of atheroemboli on toes  GI:  Soft, nontender, nondistended; with normoactive bowel sounds.   No  rebound or guarding.  No hepatosplenomegaly.  MUSCULOSKELETAL:  No joint deformity.  NEUROLOGIC:  Alert and oriented x3.  Cranial nerves grossly intact as  well sensation and strength.   CHEST X-RAY:  No acute cardiopulmonary process.  No edema.   LABORATORY STUDIES:  Creatinine (March 30, 2008) 1.32; creatinine (March 31, 2008) 1.78; creatinine (April 02, 2008) 1.32; creatinine (May 12, 2008) 5.3.  BUN 65, sodium 141, potassium 5.0, calcium 9.8, bicarb 26.  White blood cells 7.8, hemoglobin 12.3, platelets 180.   IMAGING:  1. Abdominal CT on March 30, 2008 with contrast showing bilateral renal      lesions (cysts versus other).  2. Abdominal CT without contrast on March 31, 2008 showed renal atrophy      and contrast within the renal parenchyma.  3. Abdominal CT on April 19, 2008 with IV contrast and for angiogram,      showing mild left hydronephrosis; secondary to left retroperitoneal      hematoma of left pelvic sidewall -- displacing left ureter.      Bilateral RCA opacification with excess right renal artery.   ASSESSMENT AND PLAN:  This is a 77 year old white man with history of  hypertension, GERD and abdominal aortic aneurysm, status post recent  endovascular repair.  He presented for outpatient stress test today and  was found to have creatinine of 5.3.   1. Acute Renal Failure:  The ZOX:[id number] ratio is approximately 13.      There are elevated creatinine levels in the setting of recent      initiation of Benicar, and history of 2 CT scans with contrast      within the past 2 months.  Also in the setting of large      retroperitoneal hematoma, which is displacing the left and causing      some hydronephrosis.  Renal insufficiency most likely progressive      in nature, and with these three insults, likely led to worsening.      The patient clinically looks euvolemic. No known history of kidney      disease; although there is renal atrophy noted on June 2009 first       CT scan.  There is suspicion for obstruction given CT scan findings      of unilateral (left) hydronephrosis.  IV fluid rehydration with 80      mL per hour and monitor of creatinine  over the next several days.      Agree  with discontinuing Benicar for now, and are hopeful for renal      recovery.  Consider checking renal ultrasounds to further evaluate      left hydronephrosis.  If significant obstruction or increase since      previous imaging,  percutaneous nephrostomy of left kidney may be      needed.  Check urinalysis.  2. Claudication:  Monitor, per primary.  3. History of ankle brachial indices with 1.0 and 0.93, on bilateral      lower extremities.  4. Incontinence:  Per patient, worsened after procedure.  Consider      ureter monitor.  5. Hypertension:  Will discontinue Benicar for now.  The patient has      been started on Norvasc or hydralazine, and continued on Atenolol.      Titrate up.  6. Gastroesophageal reflux disease.  History of Barrett's esophagus,      per patient.  Continue home regimen, per primary.  7. Hyperlipidemia.  Home regimen.  8. Discharge pending normalization of kidney function.      Eustaquio Boyden, MD  Electronically Signed      Wilber Bihari. Caryn Section, M.D.  Electronically Signed    JG/MEDQ  D:  05/12/2008  T:  05/12/2008  Job:  [id number]

## 2011-02-13 NOTE — Assessment & Plan Note (Signed)
OFFICE VISIT   AMEDEO, DETWEILER  DOB:  01-15-1933                                       04/19/2008  CHART#:[id number]   HISTORY:  This is a 77 year old gentleman who presented with a ruptured  abdominal aortic aneurysm.  He was treated with a stent graft done under  local anesthesia.  I last saw him a week ago where he came in  complaining of left leg pain and cramping.  A duplex at that time was  performed, which showed ankle brachial indices of 1.0 on the right and  0.93 in the left.  He comes back in today to have his first 76-month  followup CT scan as well as for exercise testing.  He said overall he is  doing very well.   EXAMINATION:  He is in no acute distress.  His abdomen is soft.  His  incisions are well healed in.   DIAGNOSTIC STUDIES:  The patient had a decrease in his ankle brachial  indices with exercise.  He dropped down to 0.8 on the left, and he was  1.0 at rest.   CT SCAN:  The patient does not have evidence of an endoleak.  However,  the Endograft is not opposed to the aortic wall on the posterior aspect  of the aorta below the renal arteries.  His aneurysm has decreased in  size, initially measuring 6.3 x 6.9 now measuring 6.1 x 6.7.   I have discussed with the patient the concern I have with his proximal  fixation.  At this time, there is no evidence of endoleak.  However,  given that the stent graft is not opposed to the aortic wall, he  certainly is at risk for developing type 1A endoleak in the future.  We  will need to follow this closely.  I have scheduled him to have a CT  angiogram in 32-months' time.   The patient is complaining of left leg claudication.  He does have  evidence of significant disease by exercise testing.  Given what all he  has been through within the last month, I would like to defer any  intervention on his left leg for at least 3 months.  I am going to see  him back in 3 months and we can discuss this  further.   [name], MD  Electronically Signed   VWB/MEDQ  D:  04/19/2008  T:  04/20/2008  Job:  [id number]

## 2011-02-13 NOTE — Op Note (Signed)
[name], [name]             ACCOUNT NO.:  [id number]   MEDICAL RECORD NO.:  [id number]          PATIENT TYPE:  INP   LOCATION:  2308                         FACILITY:  [facility]   PHYSICIAN:  Larina Earthly, M.D.    DATE OF BIRTH:  1933-09-29   DATE OF PROCEDURE:  03/30/2008  DATE OF DISCHARGE:                               OPERATIVE REPORT   PREOPERATIVE DIAGNOSIS:  Ruptured abdominal aortic aneurysm.   POSTOPERATIVE DIAGNOSIS:  Ruptured abdominal aortic aneurysm.   PROCEDURE:  Cutdown, exploration, exposure of the right common femoral  artery for stent graft repair, which will be dictated as a separate note  by Dr. Durene Cal.   ANESTHESIA:  Lidocaine local.   COMPLICATIONS:  None.   PROCEDURE IN DETAIL:  The patient was placed supine in the operating  room.  Both groins and abdomen were prepped and draped in a sterile  fashion.  I made an incision over the right femoral pulse and carried  down through the subcutaneous tissue with electrocautery.  This was with  local anesthesia.  The common femoral artery was encircled with a blue  vessel loop.  The tributary branch on the anterior lateral aspect of the  artery was ligated with 3-0 silk ties and divided for better exposure.  The artery was doubly looped with blue vessel loops proximally and  distally for control.  The remainder of the stent graft placement will  be dictated as a separate note by Dr. Durene Cal.  To completion of  the procedure, the arteries were reoccluded and closed primarily with a  running 5-0 Prolene suture.  The groin was closed with several layers of  2-0 Vicryl in the subcutaneous tissue, and the skin was closed with a 3-  0 subcuticular Vicryl stitch.      Larina Earthly, M.D.  Electronically Signed     TFE/MEDQ  D:  03/30/2008  T:  03/31/2008  Job:  [id number]

## 2011-02-13 NOTE — Op Note (Signed)
[name], [name]             ACCOUNT NO.:  [id number]   MEDICAL RECORD NO.:  [id number]          PATIENT TYPE:  AMB   LOCATION:  SDS                          FACILITY:  [facility]   PHYSICIAN:  Juleen China IV, MDDATE OF BIRTH:  1933-03-12   DATE OF PROCEDURE:  06/23/2008  DATE OF DISCHARGE:  06/23/2008                               OPERATIVE REPORT   PREOPERATIVE DIAGNOSIS:  End-stage renal disease.   POSTOPERATIVE DIAGNOSIS:  End-stage renal disease.   PROCEDURES PERFORMED:  1. Ultrasound evaluation of right internal jugular vein.  2. Right internal jugular vein Diatek catheter.  3. Left forearm arteriovenous Gore-Tex graft.   ANESTHESIA:  MAC.   SURGEON:  1. Charlena Cross, MD   ASSISTANT:  [name], PA   BLOOD LOSS:  Minimal.   PROCEDURE:  The patient was identified in the holding area and taken to  room 6, he was placed supine on the table.  The right neck and chest  were prepped and draped in standard sterile fashion.  Using ultrasound,  the right internal jugular vein was evaluated and found to be easily  compressed and widely patent.  Lidocaine 1% was used for local  anesthesia.  The internal jugular vein was accessed under ultrasound  guidance with an 18-gauge needle.  An 0.035 wire was then advanced into  the inferior vena cava under fluoroscopic visualization.  Next, tract  was dilated with sequential dilator and peel-away sheath was placed.  A  28-cm Diatek catheter was then placed through the peel-away sheath,  which was removed.  A site was selected for the skin exit site.  Lidocaine 1% was used to anesthetize the skin exit site and the  subcutaneous tunnel.  An 11 blade was used to make an incision and the  subcutaneous tunnel was created, which was then subsequently dilated.  The catheter was pulled through the subcutaneous tunnel.  The cuff was  placed through the skin exit site.  Fluoroscopy was used to confirm that  the catheter tip was at  the cavo-atrial junction and that there were no  kinks in the catheter.  Both ports were flushed and aspirated without  difficulty.  Catheter was sutured in position with a 3-0 nylon, and  incision in the neck was closed with 4-0 Vicryl.  Catheter was filled  with appropriate volumes of heparin, and sterile dressings were applied.   Attention was then turned towards the left arm graft.  The left arm was  prepped and draped in standard sterile fashion.  A transverse incision  was made in the antecubital crease.  Cautery was used dissect the  subcutaneous tissue.  The fascia was divided sharply.  The brachial  artery was identified and it was found to be an adequate artery  approximately 4.5 mm.  I did not see an adequate caliber brachial vein,  and therefore, I elected to expose the basilic vein, which had been  evaluated by ultrasound and found to be excellent in caliber.  A  transverse incision was made in the upper arm.  The basilic vein was  exposed through this  incision.  Next, a subcutaneous tunnel was created  for the graft.  A counter incision was made in the distal forearm.  A 6-  mm stretch Gore-Tex graft was brought through the tunnel.  At this point  in time, the patient was given systemic heparinization.  The artery was  occluded proximally and distally with vascular clamps and an 11 blade  was used to make an arteriotomy, which was extended with Potts scissors.  An end-to-side anastomosis was created using a running 6-0 Prolene.  Prior to completion, the artery was flushed in antegrade and retrograde  fashion.  Anastomosis was then secured.  There was excellent pulsatile  flow through the graft.  The graft was flushed with heparinized saline  and then reoccluded.  Next, attention was turned towards the venous  anastomosis.  I elected to do an end-to-side anastomosis.  The distal  end of the basilic vein was ligated with 2-0 silk.  The proximal basilic  vein was spatulated as  was the Gore-Tex graft.  An end-to-end  anastomosis was created with a 6-0 Prolene.  Prior to completion, the  graft was flushed appropriately.  Anastomosis was then secured.  All  clamps were released.  There was excellent flow to the graft as well as  to the vein.  The patient had good Doppler signals in his ulnar and  radial artery.  Heparin was reversed with protamine.  Incisions were  closed with 3-0 and 4-0 Vicryl.  The patient was taken to recovery room  in stable condition.  There were no complications.           ______________________________  V. Charlena Cross, MD  Electronically Signed     VWB/MEDQ  D:  06/25/2008  T:  06/25/2008  Job:  [id number]

## 2011-02-13 NOTE — Assessment & Plan Note (Signed)
OFFICE VISIT   [name], [name]  DOB:  1933/09/21                                       04/24/2010  CHART#:[id number]   [name] returns today for follow-up of his aneurysm.  He underwent  endovascular repair of a ruptured aneurysm on March 30, 2008.  Since that  time, he has had multiple issues:  He continues to remain on dialysis.  He dialyzes through a left forearm Gore-Tex graft.  He also had issues  with thromboembolic process to his feet and constant foot pain.  However, with observation and conservative management, his foot issues  have now 100% resolved.  He is actually doing very well at this time and  appears to be of very healthy.   REVIEW OF SYSTEMS:  GU:  Positive for kidney disease.  MUSCULOSKELETAL:  Positive for arthritis.  All other review systems negative as documented in the encounter form.   SOCIAL HISTORY:  The patient continues to be a nonsmoker since 58.  He  drinks 2 shots of liquor each day.   PHYSICAL EXAMINATION:  Heart rate 72, blood pressure 116/60, temperature  is 98.2.  GENERAL:  He is well-appearing, in no distress.  HEENT:  Within normal limits.  Respirations nonlabored.  CARDIOVASCULAR:  Regular rate and rhythm.  He has a good thrill within his left forearm graft.  ABDOMEN:  Soft, nontender.  EXTREMITIES:  Warm and well-perfused.  No cyanosis.  NEURO:  He is without focal deficits.  SKIN:  Without rash.   DIAGNOSTICS:  I have independently reviewed his CT scan that shows a  decrease in the size of his aneurysm without evidence of endoleak.   ASSESSMENT/PLAN:  Status post endovascular repair of a ruptured  abdominal aortic aneurysm.   PLAN:  The patient will come back to see me in 1 year with a repeat CT  scan.     [name], MD  Electronically Signed   VWB/MEDQ  D:  04/24/2010  T:  04/25/2010  Job:  [id number]

## 2011-02-16 NOTE — Discharge Summary (Signed)
[name], [name]             ACCOUNT NO.:  [id number]   MEDICAL RECORD NO.:  [id number]          PATIENT TYPE:  OUT   LOCATION:  MDC                          FACILITY:  [facility]   PHYSICIAN:  Lyn Records, M.D.   DATE OF BIRTH:  June 14, 1933   DATE OF ADMISSION:  05/12/2008  DATE OF DISCHARGE:  05/17/2008                               DISCHARGE SUMMARY   DISCHARGE DIAGNOSES:  1. Renal failure, likely multifactorial including possible component      of atheroembolic disease/syndrome.  2. Hypertension.  3. Coronary artery disease with history of silent inferior wall      infarct based on Cardiolite.  4. History of abdominal aneurysm rupture with endovascular repair and      stent grafting in June 2009.  5. Hyperlipidemia.  6. Gastroesophageal reflux disease.  7. Prostate cancer through his radiation therapy in 1991.  8. Left leg vascular disease.  9. Long-term medication use.   HOSPITAL COURSE:  [name] is a 77 year old male patient who had been  recently admitted with an abdominal aneurysm rupture.  He underwent  endovascular repair and stent graft approximately 1 month prior to this  admission.  During his hospitalization, it was noted that he had an  abnormal EKG without ever having a cardiovascular history.  He was seen  by Dr. Verdis Prime who ordered a stress Cardiolite.  At the time of the  Cardiolite, the patient's blood pressure was elevated at 212/94.  Apparently, there were some adjustments in his medication due to an  elevated creatinine.  Since that time, his creatinine had normalized and  he was placed back on his Benicar/HCTZ.   A followup BMET was obtained at his visit on April 28, 2008.  His  creatinine was found to be 5.30 with a continued elevated blood  pressure.  The patient was being admitted for the same.   We were concerned that the acute renal failure was chronic or secondary  to the ARB or may be even other factors.  He was admitted for renal  consult and admitted for blood pressure control.   A renal consultation was obtained to the efforts of Dr. Caryn Section.  Renal  ultrasound showed no hydronephrosis.  The patient was hydrated  vigorously.  His urinalysis showed no active sediment.  His blood  pressure medications were increased accordingly during this time.  Renal  service felt that the patient had a slight decline in creatinine and  hopefully the ATN was secondary to ARB/recent dye load for the abdominal  aortic aneurysm, and hypertension.   By the time of discharge, his creatinine had decreased to 4.09 and the  Renal Service was summoned off with instructions for the patient to  follow up in 1 week.  His blood pressure had decreased to 141/82 and we  have discharged him to home on these medications:  1. Norvasc 10 mg a day.  2. Baby aspirin 81 mg a day.  3. Atenolol 75 mg a day.  4. Zetia 10 mg a day.  5. Omeprazole 20 mg a day.  6. Fish oil 1200 mg a  day.  7. Multivitamin daily.  8. Advicor 500/20 mg 1 tablet daily.  9. Benicar/HCTZ.   FOLLOWUP:  Follow up with the kidney doctors on May 24, 2008, at 2  p.m. to check kidney function and another appointment on May 27, 2008, at 1 p.m. again to check kidney function.  The patient is to  return to see Dr. Rosann Auerbach, P.A. at this appointment.  The  patient is to follow up to see Dr. Katrinka Blazing on June 07, 2008, at 9:45  a.m.  Again lab work will be obtained at that visit.  The patient is to  follow renal diet, increase activity slowly.  Call for any further  questions or concerns.      Guy Franco, P.A.      Lyn Records, M.D.  Electronically Signed    LB/MEDQ  D:  08/27/2008  T:  08/28/2008  Job:  [id number]   cc:   Marina Gravel, MD

## 2011-03-15 ENCOUNTER — Other Ambulatory Visit (HOSPITAL_COMMUNITY): Payer: Self-pay | Admitting: Nephrology

## 2011-03-15 DIAGNOSIS — T82868A Thrombosis of vascular prosthetic devices, implants and grafts, initial encounter: Secondary | ICD-10-CM

## 2011-03-16 ENCOUNTER — Ambulatory Visit (HOSPITAL_COMMUNITY)
Admission: RE | Admit: 2011-03-16 | Discharge: 2011-03-16 | Disposition: A | Discharge status: Home / Self Care | Payer: Medicare Other | Source: Ambulatory Visit | Attending: Nephrology | Admitting: Nephrology

## 2011-03-16 ENCOUNTER — Other Ambulatory Visit (HOSPITAL_COMMUNITY): Payer: Self-pay | Admitting: Nephrology

## 2011-03-16 ENCOUNTER — Ambulatory Visit (HOSPITAL_COMMUNITY): Admission: RE | Admit: 2011-03-16 | Payer: Medicare Other | Source: Ambulatory Visit | Admitting: Nephrology

## 2011-03-16 DIAGNOSIS — Y832 Surgical operation with anastomosis, bypass or graft as the cause of abnormal reaction of the patient, or of later complication, without mention of misadventure at the time of the procedure: Secondary | ICD-10-CM | POA: Insufficient documentation

## 2011-03-16 DIAGNOSIS — I739 Peripheral vascular disease, unspecified: Secondary | ICD-10-CM | POA: Insufficient documentation

## 2011-03-16 DIAGNOSIS — D649 Anemia, unspecified: Secondary | ICD-10-CM | POA: Insufficient documentation

## 2011-03-16 DIAGNOSIS — T82898A Other specified complication of vascular prosthetic devices, implants and grafts, initial encounter: Secondary | ICD-10-CM | POA: Insufficient documentation

## 2011-03-16 DIAGNOSIS — T82868A Thrombosis of vascular prosthetic devices, implants and grafts, initial encounter: Secondary | ICD-10-CM

## 2011-03-16 DIAGNOSIS — I4891 Unspecified atrial fibrillation: Secondary | ICD-10-CM | POA: Insufficient documentation

## 2011-03-16 DIAGNOSIS — N186 End stage renal disease: Secondary | ICD-10-CM | POA: Insufficient documentation

## 2011-03-16 DIAGNOSIS — I120 Hypertensive chronic kidney disease with stage 5 chronic kidney disease or end stage renal disease: Secondary | ICD-10-CM | POA: Insufficient documentation

## 2011-03-16 DIAGNOSIS — K219 Gastro-esophageal reflux disease without esophagitis: Secondary | ICD-10-CM | POA: Insufficient documentation

## 2011-03-16 LAB — PROTIME-INR: INR: 1.92 — ABNORMAL HIGH (ref 0.00–1.49)

## 2011-03-16 MED ORDER — IOHEXOL 300 MG/ML  SOLN: 100.0000 mL | Freq: Once | INTRAMUSCULAR | Status: AC | PRN

## 2011-03-27 ENCOUNTER — Other Ambulatory Visit (HOSPITAL_COMMUNITY): Payer: Self-pay | Admitting: Nephrology

## 2011-03-27 DIAGNOSIS — N186 End stage renal disease: Secondary | ICD-10-CM

## 2011-03-28 ENCOUNTER — Other Ambulatory Visit (HOSPITAL_COMMUNITY): Payer: Self-pay | Admitting: Nephrology

## 2011-03-28 ENCOUNTER — Ambulatory Visit (HOSPITAL_COMMUNITY)
Admission: RE | Admit: 2011-03-28 | Discharge: 2011-03-28 | Disposition: A | Discharge status: Home / Self Care | Payer: Medicare Other | Source: Ambulatory Visit | Attending: Nephrology | Admitting: Nephrology

## 2011-03-28 DIAGNOSIS — K219 Gastro-esophageal reflux disease without esophagitis: Secondary | ICD-10-CM | POA: Insufficient documentation

## 2011-03-28 DIAGNOSIS — I120 Hypertensive chronic kidney disease with stage 5 chronic kidney disease or end stage renal disease: Secondary | ICD-10-CM | POA: Insufficient documentation

## 2011-03-28 DIAGNOSIS — H5440 Blindness, one eye, unspecified eye: Secondary | ICD-10-CM | POA: Insufficient documentation

## 2011-03-28 DIAGNOSIS — D649 Anemia, unspecified: Secondary | ICD-10-CM | POA: Insufficient documentation

## 2011-03-28 DIAGNOSIS — I4891 Unspecified atrial fibrillation: Secondary | ICD-10-CM | POA: Insufficient documentation

## 2011-03-28 DIAGNOSIS — T82898A Other specified complication of vascular prosthetic devices, implants and grafts, initial encounter: Secondary | ICD-10-CM | POA: Insufficient documentation

## 2011-03-28 DIAGNOSIS — Y832 Surgical operation with anastomosis, bypass or graft as the cause of abnormal reaction of the patient, or of later complication, without mention of misadventure at the time of the procedure: Secondary | ICD-10-CM | POA: Insufficient documentation

## 2011-03-28 DIAGNOSIS — N186 End stage renal disease: Secondary | ICD-10-CM | POA: Insufficient documentation

## 2011-03-28 DIAGNOSIS — I2510 Atherosclerotic heart disease of native coronary artery without angina pectoris: Secondary | ICD-10-CM | POA: Insufficient documentation

## 2011-03-28 DIAGNOSIS — Z8546 Personal history of malignant neoplasm of prostate: Secondary | ICD-10-CM | POA: Insufficient documentation

## 2011-03-28 DIAGNOSIS — I739 Peripheral vascular disease, unspecified: Secondary | ICD-10-CM | POA: Insufficient documentation

## 2011-03-28 MED ORDER — IOHEXOL 300 MG/ML  SOLN: 30.0000 mL | Freq: Once | INTRAMUSCULAR | Status: AC | PRN

## 2011-04-03 ENCOUNTER — Other Ambulatory Visit (HOSPITAL_COMMUNITY): Payer: Self-pay | Admitting: Nephrology

## 2011-04-03 DIAGNOSIS — T8249XA Other complication of vascular dialysis catheter, initial encounter: Secondary | ICD-10-CM

## 2011-04-05 ENCOUNTER — Ambulatory Visit (HOSPITAL_COMMUNITY)
Admission: RE | Admit: 2011-04-05 | Discharge: 2011-04-05 | Disposition: A | Discharge status: Home / Self Care | Payer: Medicare Other | Source: Ambulatory Visit | Attending: Nephrology | Admitting: Nephrology

## 2011-04-05 ENCOUNTER — Ambulatory Visit (HOSPITAL_COMMUNITY)
Admission: RE | Admit: 2011-04-05 | Discharge: 2011-04-05 | Disposition: A | Discharge status: Home / Self Care | Payer: Medicare HMO | Source: Ambulatory Visit | Attending: Vascular Surgery | Admitting: Vascular Surgery

## 2011-04-05 DIAGNOSIS — Y832 Surgical operation with anastomosis, bypass or graft as the cause of abnormal reaction of the patient, or of later complication, without mention of misadventure at the time of the procedure: Secondary | ICD-10-CM | POA: Insufficient documentation

## 2011-04-05 DIAGNOSIS — I4891 Unspecified atrial fibrillation: Secondary | ICD-10-CM | POA: Insufficient documentation

## 2011-04-05 DIAGNOSIS — D649 Anemia, unspecified: Secondary | ICD-10-CM | POA: Insufficient documentation

## 2011-04-05 DIAGNOSIS — T82898A Other specified complication of vascular prosthetic devices, implants and grafts, initial encounter: Secondary | ICD-10-CM | POA: Insufficient documentation

## 2011-04-05 DIAGNOSIS — Z8546 Personal history of malignant neoplasm of prostate: Secondary | ICD-10-CM | POA: Insufficient documentation

## 2011-04-05 DIAGNOSIS — N186 End stage renal disease: Secondary | ICD-10-CM | POA: Insufficient documentation

## 2011-04-05 DIAGNOSIS — K219 Gastro-esophageal reflux disease without esophagitis: Secondary | ICD-10-CM | POA: Insufficient documentation

## 2011-04-05 DIAGNOSIS — T8249XA Other complication of vascular dialysis catheter, initial encounter: Secondary | ICD-10-CM

## 2011-04-05 DIAGNOSIS — N2581 Secondary hyperparathyroidism of renal origin: Secondary | ICD-10-CM | POA: Insufficient documentation

## 2011-04-05 LAB — POCT I-STAT 4, (NA,K, GLUC, HGB,HCT)
Glucose, Bld: 84 mg/dL (ref 70–99)
Sodium: 143 mEq/L (ref 135–145)

## 2011-04-05 LAB — POTASSIUM: Potassium: 3.7 mEq/L (ref 3.5–5.1)

## 2011-04-05 MED ORDER — IOHEXOL 300 MG/ML  SOLN
100.0000 mL | Freq: Once | INTRAMUSCULAR | Status: AC | PRN
  Administered 2011-04-05: 50 mL via INTRAVENOUS

## 2011-04-05 MED ORDER — IOHEXOL 300 MG/ML  SOLN
100.0000 mL | Freq: Once | INTRAMUSCULAR | Status: AC | PRN
  Administered 2011-04-05: 100 mL via INTRAVENOUS

## 2011-04-11 ENCOUNTER — Other Ambulatory Visit: Payer: Self-pay | Admitting: Surgery

## 2011-04-11 DIAGNOSIS — I714 Abdominal aortic aneurysm, without rupture: Secondary | ICD-10-CM

## 2011-04-13 ENCOUNTER — Ambulatory Visit (HOSPITAL_COMMUNITY)
Admission: RE | Admit: 2011-04-13 | Discharge: 2011-04-14 | Disposition: A | Discharge status: Home / Self Care | Payer: Medicare Other | Source: Ambulatory Visit | Attending: Surgery | Admitting: Surgery

## 2011-04-13 ENCOUNTER — Ambulatory Visit (HOSPITAL_COMMUNITY): Payer: Medicare Other

## 2011-04-13 DIAGNOSIS — Z01818 Encounter for other preprocedural examination: Secondary | ICD-10-CM | POA: Insufficient documentation

## 2011-04-13 DIAGNOSIS — I2510 Atherosclerotic heart disease of native coronary artery without angina pectoris: Secondary | ICD-10-CM | POA: Insufficient documentation

## 2011-04-13 DIAGNOSIS — K219 Gastro-esophageal reflux disease without esophagitis: Secondary | ICD-10-CM | POA: Insufficient documentation

## 2011-04-13 DIAGNOSIS — Y832 Surgical operation with anastomosis, bypass or graft as the cause of abnormal reaction of the patient, or of later complication, without mention of misadventure at the time of the procedure: Secondary | ICD-10-CM | POA: Insufficient documentation

## 2011-04-13 DIAGNOSIS — Z7901 Long term (current) use of anticoagulants: Secondary | ICD-10-CM | POA: Insufficient documentation

## 2011-04-13 DIAGNOSIS — Z992 Dependence on renal dialysis: Secondary | ICD-10-CM | POA: Insufficient documentation

## 2011-04-13 DIAGNOSIS — I4891 Unspecified atrial fibrillation: Secondary | ICD-10-CM | POA: Insufficient documentation

## 2011-04-13 DIAGNOSIS — T82898A Other specified complication of vascular prosthetic devices, implants and grafts, initial encounter: Secondary | ICD-10-CM | POA: Insufficient documentation

## 2011-04-13 DIAGNOSIS — N186 End stage renal disease: Secondary | ICD-10-CM

## 2011-04-13 DIAGNOSIS — Z01812 Encounter for preprocedural laboratory examination: Secondary | ICD-10-CM | POA: Insufficient documentation

## 2011-04-13 DIAGNOSIS — I120 Hypertensive chronic kidney disease with stage 5 chronic kidney disease or end stage renal disease: Secondary | ICD-10-CM

## 2011-04-13 DIAGNOSIS — Z01810 Encounter for preprocedural cardiovascular examination: Secondary | ICD-10-CM | POA: Insufficient documentation

## 2011-04-13 DIAGNOSIS — Z87891 Personal history of nicotine dependence: Secondary | ICD-10-CM | POA: Insufficient documentation

## 2011-04-13 LAB — PROTIME-INR
INR: 1.82 — ABNORMAL HIGH (ref 0.00–1.49)
Prothrombin Time: 21.4 seconds — ABNORMAL HIGH (ref 11.6–15.2)

## 2011-04-13 LAB — POCT I-STAT 4, (NA,K, GLUC, HGB,HCT)
Glucose, Bld: 106 mg/dL — ABNORMAL HIGH (ref 70–99)
HCT: 35 % — ABNORMAL LOW (ref 39.0–52.0)
Hemoglobin: 11.9 g/dL — ABNORMAL LOW (ref 13.0–17.0)
Potassium: 4 mEq/L (ref 3.5–5.1)

## 2011-04-14 LAB — PREPARE FRESH FROZEN PLASMA

## 2011-04-14 LAB — PROTIME-INR: Prothrombin Time: 21.2 seconds — ABNORMAL HIGH (ref 11.6–15.2)

## 2011-04-20 ENCOUNTER — Encounter: Payer: Self-pay | Admitting: Surgery

## 2011-04-23 ENCOUNTER — Ambulatory Visit (HOSPITAL_COMMUNITY)
Admission: RE | Admit: 2011-04-23 | Discharge: 2011-04-23 | Disposition: A | Discharge status: Home / Self Care | Payer: Medicare Other | Source: Ambulatory Visit | Attending: Vascular Surgery | Admitting: Vascular Surgery

## 2011-04-23 ENCOUNTER — Ambulatory Visit (HOSPITAL_COMMUNITY): Payer: Medicare Other

## 2011-04-23 DIAGNOSIS — Z79899 Other long term (current) drug therapy: Secondary | ICD-10-CM | POA: Insufficient documentation

## 2011-04-23 DIAGNOSIS — I4891 Unspecified atrial fibrillation: Secondary | ICD-10-CM | POA: Insufficient documentation

## 2011-04-23 DIAGNOSIS — N186 End stage renal disease: Secondary | ICD-10-CM | POA: Insufficient documentation

## 2011-04-23 DIAGNOSIS — Z01812 Encounter for preprocedural laboratory examination: Secondary | ICD-10-CM | POA: Insufficient documentation

## 2011-04-23 DIAGNOSIS — Z01810 Encounter for preprocedural cardiovascular examination: Secondary | ICD-10-CM | POA: Insufficient documentation

## 2011-04-23 DIAGNOSIS — I120 Hypertensive chronic kidney disease with stage 5 chronic kidney disease or end stage renal disease: Secondary | ICD-10-CM | POA: Insufficient documentation

## 2011-04-23 DIAGNOSIS — Y832 Surgical operation with anastomosis, bypass or graft as the cause of abnormal reaction of the patient, or of later complication, without mention of misadventure at the time of the procedure: Secondary | ICD-10-CM | POA: Insufficient documentation

## 2011-04-23 DIAGNOSIS — Z992 Dependence on renal dialysis: Secondary | ICD-10-CM | POA: Insufficient documentation

## 2011-04-23 DIAGNOSIS — Z7901 Long term (current) use of anticoagulants: Secondary | ICD-10-CM | POA: Insufficient documentation

## 2011-04-23 DIAGNOSIS — Z01818 Encounter for other preprocedural examination: Secondary | ICD-10-CM | POA: Insufficient documentation

## 2011-04-23 DIAGNOSIS — T82898A Other specified complication of vascular prosthetic devices, implants and grafts, initial encounter: Secondary | ICD-10-CM | POA: Insufficient documentation

## 2011-04-23 DIAGNOSIS — R791 Abnormal coagulation profile: Secondary | ICD-10-CM | POA: Insufficient documentation

## 2011-04-23 HISTORY — PX: AV FISTULA PLACEMENT: SHX1204

## 2011-04-23 LAB — POCT I-STAT 4, (NA,K, GLUC, HGB,HCT)
Glucose, Bld: 114 mg/dL — ABNORMAL HIGH (ref 70–99)
Potassium: 3.8 mEq/L (ref 3.5–5.1)

## 2011-04-23 LAB — PROTIME-INR: Prothrombin Time: 21.5 seconds — ABNORMAL HIGH (ref 11.6–15.2)

## 2011-04-24 LAB — PREPARE FRESH FROZEN PLASMA: Unit division: 0

## 2011-04-25 NOTE — Op Note (Signed)
NAME:  [name], [name] NO.:  [id number]  MEDICAL RECORD NO.:  [id number]  LOCATION:  [facility]                         FACILITY:  [facility]  PHYSICIAN:  Fransisco Hertz, MD       DATE OF BIRTH:  12/10/1932  DATE OF PROCEDURE:  04/23/2011 DATE OF DISCHARGE:  04/23/2011                              OPERATIVE REPORT   PROCEDURE:  Right internal jugular vein cannulation under ultrasound guidance and placement of right a internal jugular vein tunneled dialysis catheter.  PREOPERATIVE DIAGNOSES:  End-stage renal disease requiring hemodialysis and left forearm arteriovenous graft thrombosis.  POSTOPERATIVE DIAGNOSIS:  End-stage renal disease requiring hemodialysis and left forearm arteriovenous graft thrombosis.  SURGEON:  Arlys John L. Imogene Burn, MD  ANESTHESIA:  Monitor anesthesia care.  FINDINGS:  Tips of the catheter in the right atrium.  SPECIMENS:  None.  ESTIMATED BLOOD LOSS:  Minimal.  INDICATIONS:  This is a 77 year old gentleman who presents at  2 weeks out from a thrombectomy, revision of his left forearm arteriovenous graft.  Unfortunately, once again the graft thrombosed.  He presents today for evaluation for placement of tunneled dialysis catheter and also revision versus placement of a left upper arm graft.  When he was seen in holding area, was found to have INR that was therapeutic.  He was given 2 units of fresh frozen plasma, but his INR continued to be elevated 1.8.  Subsequently as this gentleman has not dialyzed since Thursday, I felt that the priority here would be placement of a tunneled dialysis catheter followed by urgent dialysis and then later this week placement of his left upper arm graft.  The patient agreed to proceed with this plan.  Risks of this procedure include but are not limited to bleeding, infection, possible central venous injury, possible need for additional procedures, and also risk of pneumothorax.  The patient was aware of these  risks and agreed to proceed forward.  DESCRIPTION OF OPERATION:  After full informed written consent was obtained from the patient, he was brought back to the operating room and placed supine upon the operating room table then prepped and draped in a standard fashion for either a neck or chest tunneled dialysis catheter. I turned my attention first to his right neck under ultrasound guidance, identified a right internal jugular vein that was quite distended and difficult to compress.  Under ultrasound guidance, I cannulated this right internal jugular vein with an 18-gauge needle and passed the wire down into inferior vena cava.  I then clamped this wire to the drapes and verified the position of the wire and inferior vena cava on fluoroscopy then anesthetized the neck and chest and subcutaneous tunnel with a total of 60 mL of one-to-one mixture of 0.5% Marcaine without epinephrine and 1% lidocaine with epinephrine.  After obtaining adequate local anesthesia at this point, I made a stab incisions on the chest and at the neck site then dissected from chest to neck with metal tunneler and then dilated this tract with a plastic dilator.  At this point, I unclamped the wire, removed the needle from the wire and then attempted to pass the smallest dilator over the wire, however, in the process  of doing this wire appeared to be buckling subsequently.  I placed an end- hole catheter over this wire down into the inferior vena cava and then exchanged out the wire for Amplatz wire.  The catheter was then removed over the wire then I placed a medium dilator and eventually, I was able to place the dilator sheath under fluoroscopic guidance over this Amplatz wire.  The dilator sheath was hubbed into the superior vena cava.  The wire was then removed along with the dilator and a 23-cm Diatek catheter was loaded through this sheath and hubbed and then broke the sheath and peeled it away while holding  the cuff of the catheter at the level of skin.  The back end of this catheter was then connected to a metal tunneler and passed in retrograde fashion through the subcutaneous tunnel.  I then pulled it to appropriate position with the cuff about a centimeter away from the exit site.  Under fluoroscopic guidance, identified the tips of this catheter in the right atrium.  The rest of this catheter also demonstrated no kinking.  I then at this point tested each port by aspirating and flushing without any resistance and loaded each port with heparinized saline.  Catheter was secured in place with two interrupted stitches of nylon tied to the catheter then the neck incision was repaired with a U stitch of 4-0 Monocryl.  Skin was cleaned, dry sterile bandages were applied.  Each port was then loaded with concentrated heparin at 1000 unit/mL at the manufacturer recommended volumes to each port and then each port was capped sterilely.  The patient tolerated this procedure fine.  COMPLICATIONS:  None.  CONDITION:  Stable.     Fransisco Hertz, MD     BLC/MEDQ  D:  04/23/2011  T:  04/24/2011  Job:  [id number]  Electronically Signed by Leonides Sake MD on 04/25/2011 07:41:16 AM

## 2011-04-30 ENCOUNTER — Ambulatory Visit (HOSPITAL_COMMUNITY)
Admission: RE | Admit: 2011-04-30 | Discharge: 2011-04-30 | Disposition: A | Discharge status: Home / Self Care | Payer: Medicare Other | Source: Ambulatory Visit | Attending: Vascular Surgery | Admitting: Vascular Surgery

## 2011-04-30 DIAGNOSIS — N186 End stage renal disease: Secondary | ICD-10-CM | POA: Insufficient documentation

## 2011-04-30 DIAGNOSIS — I120 Hypertensive chronic kidney disease with stage 5 chronic kidney disease or end stage renal disease: Secondary | ICD-10-CM

## 2011-04-30 DIAGNOSIS — Z992 Dependence on renal dialysis: Secondary | ICD-10-CM | POA: Insufficient documentation

## 2011-04-30 LAB — POCT I-STAT 4, (NA,K, GLUC, HGB,HCT)
Hemoglobin: 11.9 g/dL — ABNORMAL LOW (ref 13.0–17.0)
Potassium: 3.3 mEq/L — ABNORMAL LOW (ref 3.5–5.1)
Sodium: 139 mEq/L (ref 135–145)

## 2011-04-30 LAB — PROTIME-INR: Prothrombin Time: 13.5 seconds (ref 11.6–15.2)

## 2011-05-02 NOTE — Op Note (Signed)
NAME:  [name], [name] NO.:  [id number]  MEDICAL RECORD NO.:  [id number]  LOCATION:  [facility]                         FACILITY:  [facility]  PHYSICIAN:  Fransisco Hertz, MD       DATE OF BIRTH:  15-Jan-1933  DATE OF PROCEDURE: DATE OF DISCHARGE:  04/30/2011                              OPERATIVE REPORT   PROCEDURE:  Placement of left upper arm arteriovenous graft.  PREOPERATIVE DIAGNOSIS:  End-stage renal disease requiring hemodialysis.  POSTOPERATIVE DIAGNOSIS:  End-stage renal disease requiring hemodialysis.  SURGEON:  Fransisco Hertz, MD.  ANESTHESIA:  Monitored anesthesia care and local.  FINDINGS IN THIS CASE:  Palpable left radial pulse and palpable upper arm thrill at end of case.  SPECIMENS:  None.  INDICATIONS:  This is a 77 year old gentleman who previously has had a left forearm arteriovenous graft, has undergone revision before and percutaneous interventions without any success.  He unfortunately thrombosed.  The patient is on Coumadin as outpatient and subsequently when he was seen last Monday for placement of his graft, he could not proceed forward as he was partially anticoagulated.  Subsequently I only placed a tunneled dialysis catheter last week with the plan to complete his left upper arm graft after he successfully reversed his anticoagulation.  He presents today for that upper arm procedure.  He is aware of risks of this procedure include bleeding, infection, possible ischemic monomeric neuropathy, possible nerve damage, possible steal syndrome, possible failure to mature, and possible need for additional procedures.  He is aware of these risks and agreed to proceed forward.  DESCRIPTION OF THE OPERATION:  After full informed written consent was obtained from the patient, he was brought back to the operating room, placed supine upon the operating room table.  Prior to induction, he had received IV antibiotics.  After obtaining adequate  anesthesia, he was prepped and draped in standard fashion for a left arm first access procedure.  We then turned our attention to his antecubitum.  I anesthetized the area of his antecubitum with a 1:1 mixture of 0.5%  Marcaine without epinephrine and 1% lidocaine with epinephrine.  In total throughout this case, 80 mL was injected both at the antecubitum  and axilla.  After testing anesthesia, I made a longitudinal incision.   I then  made a incision at the antecubitum and dissected down to the brachial  artery.  This was dissected out.  The arterial graft anastomosis easily found  and there was a palpable pulse in the proximal graft.  The brachial artery was controlled proximally and distally with vessel loops.  The artery appeared to be externally of good size about 4 mm.  I then turned my attention to his axillary where I made a longitudinal incision from the anterior axillary down along the bicipital groove.   Using blunt dissection  and electrocautery, I developed a plane down to the fascia down to the high brachial vein which was externally greater than 7 mm in diameter. Subsequently I felt this was an excellent outflow.  I then dissected from the antecubital incision to the axillary incision with a curvilinear metal tunneler after injecting additional portion of this local anesthetic mixture.  I then placed a 4-mm x 7-mm graft through this metal tunnel and removed the tunnel.  In comparing the 4 mm into the graft that was present at the antecubital, it became evident to me that this was a 6-mm graft.  Subsequently I pulled the graft down till I had a good 6 mm segment at the graft.  I then at this point placed the brachial artery under tension proximally and distally, and transected the graft about a couple of centimeters distal to its anastomosis.  Then I looked down into the anastomosis.  There was absolutely no neointimal hyperplasia here.  It was widely patent arterial  anastomosis to this artery.  I placed dilators and easily up to a 4-mm dilator passed through this anastomosis.  Subsequently I felt that I was going to do an end-to-end anastomosis of this portion of the graft.  I had already spatulated the graft to meet the dimensions of this 6-mm graft that was sewn to the artery.  In an end-to-end fashion, I sewed this in an with a 6-0 Prolene and then I clamped the new graft just distal to this new anastomosis and then placed thrombin and Gelfoam around this anastomosis and I released all the vessel loops immediately to regain a radial pulse and also a pulse in the new arterial anastomosis.  I then pulled the graft to appropriate tension in its tract and turned my attention to high brachial vein.  I tied off the distal portion of this vein and then transected the vein more proximally in the process spatulating the vein. This was a good 7 mm at least at this point.  I then spatulated the graft 7 mm in to meet the dimensions of this vein and it was sewn in end- to-end fashion with a running stitch of 6-0 Prolene.  Prior to completing the anastomosis, I allowed the vein to backbleed.  There was excellent backbleeding and I allowed the graft to bleed in an antegrade fashion.  There was pulsatile bleeding antegrade.  I then completed the anastomosis in usual fashion, placed thrombin and Gelfoam above anastomoses.  After waiting a few minutes, I washed out both incisions. There was no more active bleeding at this point.  The subcutaneous tissue was then reapproximated with running stitch of 3-0 Vicryl in both incisions and the skin was reapproximated with running stitch of 4-0 Monocryl and in a running subcuticular fashion.  The skin was then cleaned, dried, and  Dermabond was used to reinforce the closure.  The patient tolerated this  procedure fine.  At the end of the case, there was a good thrill in the  upper arm graft and also palpable radial  pulse.  COMPLICATIONS:  None.  CONDITION:  Stable.     Fransisco Hertz, MD     BLC/MEDQ  D:  04/30/2011  T:  05/01/2011  Job:  [id number]  Electronically Signed by Leonides Sake MD on 05/02/2011 08:52:49 AM

## 2011-05-07 ENCOUNTER — Ambulatory Visit
Admission: RE | Admit: 2011-05-07 | Discharge: 2011-05-07 | Disposition: A | Discharge status: Home / Self Care | Payer: Medicare Other | Source: Ambulatory Visit | Attending: Surgery | Admitting: Surgery

## 2011-05-07 ENCOUNTER — Ambulatory Visit: Payer: 59 | Admitting: Surgery

## 2011-05-07 DIAGNOSIS — I714 Abdominal aortic aneurysm, without rupture: Secondary | ICD-10-CM

## 2011-05-07 MED ORDER — IOHEXOL 350 MG/ML SOLN
100.0000 mL | Freq: Once | INTRAVENOUS | Status: AC | PRN
  Administered 2011-05-07: 100 mL via INTRAVENOUS

## 2011-05-15 NOTE — Progress Notes (Deleted)
Subjective:     Patient ID: [name], male   DOB: Jul 09, 1933, 77 y.o.   MRN: [id number]  HPI   Review of Systems    Objective:   Physical Exam     Assessment:     ***    Plan:     ***

## 2011-05-18 ENCOUNTER — Ambulatory Visit: Payer: Medicare Other | Admitting: Thoracic Diseases

## 2011-05-21 ENCOUNTER — Encounter: Payer: Self-pay | Admitting: Surgery

## 2011-05-21 ENCOUNTER — Ambulatory Visit (INDEPENDENT_AMBULATORY_CARE_PROVIDER_SITE_OTHER): Payer: Medicare Other | Admitting: Surgery

## 2011-05-21 VITALS — BP 180/83 | HR 73 | Ht 70.0 in | Wt 189.0 lb

## 2011-05-21 DIAGNOSIS — I713 Abdominal aortic aneurysm, ruptured: Secondary | ICD-10-CM

## 2011-05-21 DIAGNOSIS — N186 End stage renal disease: Secondary | ICD-10-CM

## 2011-05-21 NOTE — Progress Notes (Signed)
Subjective:     Patient ID: [name], male   DOB: 10-Apr-1933, 77 y.o.   MRN: [id number]  HPI The patient returns today for followup he is status post endovascular repair of a ruptured abdominal aortic aneurysm on 03/30/2008 using a Gore Excluder device this was done under local anesthesia. Following his procedure he progressed to end-stage renal disease requiring dialysis. 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 He has been on dialysis for some time now and is being maintained on Coumadin. He recently underwent dialysis catheter placement and upper arm graft placement by Dr. Imogene Burn for an occluded left forearm graft. He also had a CT scan for followup of his aneurysm. Today he endorses no complaints.  Review of Systems  Constitutional: Negative for fever, chills and fatigue.  Cardiovascular: Negative for chest pain.  Gastrointestinal: Negative for abdominal pain.       Objective:   Physical Exam  Constitutional: He appears well-developed and well-nourished. No distress.  HENT:  Head: Normocephalic and atraumatic.  Neck: Neck supple.  Cardiovascular: Normal rate.        Excellent thrill within the left upper arm Gore-Tex graft incisions are well-healed  Pulmonary/Chest: Effort normal.  Abdominal: Soft. There is no tenderness.  Musculoskeletal: Normal range of motion.  Skin: Skin is warm and dry.   Diagnostic studies: The patient had a CT scan on August 6 this shows a stable appearance of his aneurysm. There is no evidence of endoleak and aneurysm has decreased in size it now measures 4.3 x 4.5 previously it was 4.5 x 4.7    Assessment:     Status post endovascular aneurysm repair  End stage renal disease status post left upper arm graft    Plan:     Abdominal aortic aneurysm: The patient's repair is intact. He continues to have angulation of the neck where the graft was not fully opposed to the posterior wall however this has been stable and there is no evidence of a  type I leak. I will need to continue to follow this he'll need a followup ultrasound in one year.  Renal disease: The patient continues to dialyze the catheter he has a new left upper arm graft which has recently been placed the incisions are well healed and there is an excellent thrill to the graft. He'll need to be scheduled for catheter removal once his graft has become incorporated in a couple weeks.

## 2011-05-30 ENCOUNTER — Ambulatory Visit: Payer: Self-pay

## 2011-05-30 NOTE — Op Note (Signed)
NAMEDESMOND, SZABO NO.:  [id number]  MEDICAL RECORD NO.:  [id number]  LOCATION:  6705                         FACILITY:  [facility]  PHYSICIAN:  Juleen China IV, MDDATE OF BIRTH:  1933-07-04  DATE OF PROCEDURE:  04/13/2011 DATE OF DISCHARGE:  04/14/2011                              OPERATIVE REPORT   PREOPERATIVE DIAGNOSIS:  Occluded left forearm Gore-Tex graft.  POSTOPERATIVE DIAGNOSIS:  Occluded left forearm Gore-Tex graft.  PROCEDURE PERFORMED:  Thrombectomy and revision of left forearm arteriovenous Gore-Tex graft.  SURGEON: 1. Durene Cal IV, MD  ANESTHESIA:  MAC.  FINDINGS:  A 6-mm interposition graft was placed.  INDICATION:  This is a 77 year old gentleman who dialyzes via left forearm graft.  He has previously had undergone thrombolysis and a venous stent was placed extending up into the mid upper arm.  He comes in today for surgical revision.  PROCEDURE:  The patient was identified in the holding area, taken to the operating room, and was placed supine on the table.  MAC anesthesia was administered.  The patient was prepped and draped in usual fashion.  A time-out was called.  I made a longitudinal incision in the upper arm. I used ultrasound to identify the location of the stent and made the incision proximal to it.  The subcutaneous tissue was divided with a cautery.  Sharp dissection was then used to dissect out the vein.  The vein was circumferentially dissected free and encircled with vessel loop and excellent down to the antecubital crease.  At this area, I made an incision over the venous limb of the graft.  The graft was circumferentially exposed here.  I then created a tunnel between the 2 incisions and brought a 6-mm interposition graft through the tunnel.  At this point, I gave the patient heparin.  I then proceeded with transecting the graft at the level of the antecubital crease.  I then performed thrombectomy using a #3,  #4, and #5 Fogarty catheters. I was ultimately able to get inflow.  The arterial plug was removed.  The graft flushed with heparin saline and reoccluded.  I selected the 10-mm interposition graft and performed end-to-end anastomosis with running CV6 Gore suture.  Once this was completed, I flushed the graft.  There was good pulsatile flow through the graft.  This was then flushed with heparin saline and reoccluded.  Attention was then turned towards the venous anastomosis.  I elected to make this end-to-end anastomosis.  I ligated the distal end of the vein with a 2-0 silk tie.  I then cut the graft to the appropriate length and spatulated the vein in the Gore-Tex graft and performed an end-to-end anastomosis with running 6-0 Prolene. Prior to the completion, appropriate flush maneuvers were performed. The anastomosis was completed.  All clamps were released.  There was a good flow through the graft with a palpable thrill within the graft.  At this point, I was satisfied with the results.  The patient's heparin was reversed with protamine.  Wounds were irrigated.  Tissue was closed.  I closed the deep layer with 3-0 Vicryl and skin with 4-0 Vicryl.  The patient tolerated the procedure well.  There  were no complications.     [name], MD     VWB/MEDQ  D:  04/22/2011  T:  04/23/2011  Job:  [id number]  Electronically Signed by [name] IV MD on 05/30/2011 11:50:21 PM

## 2011-06-11 ENCOUNTER — Other Ambulatory Visit: Payer: Self-pay | Admitting: Vascular Surgery

## 2011-06-11 ENCOUNTER — Ambulatory Visit (HOSPITAL_COMMUNITY): Payer: Medicare Other | Attending: Vascular Surgery

## 2011-06-11 DIAGNOSIS — N186 End stage renal disease: Secondary | ICD-10-CM

## 2011-06-11 DIAGNOSIS — Z452 Encounter for adjustment and management of vascular access device: Secondary | ICD-10-CM | POA: Insufficient documentation

## 2011-06-11 LAB — PROTIME-INR: INR: 1.19 (ref 0.00–1.49)

## 2011-06-11 LAB — APTT: aPTT: 32 seconds (ref 24–37)

## 2011-06-28 LAB — POCT I-STAT 7, (LYTES, BLD GAS, ICA,H+H)
Bicarbonate: 19.7 — ABNORMAL LOW
Hemoglobin: 8.2 — ABNORMAL LOW
O2 Saturation: 98
Potassium: 4.3
TCO2: 21
pCO2 arterial: 32 — ABNORMAL LOW
pH, Arterial: 7.398

## 2011-06-28 LAB — POCT I-STAT, CHEM 8
Calcium, Ion: 1.17
Creatinine, Ser: 1.3
Glucose, Bld: 256 — ABNORMAL HIGH
HCT: 38 — ABNORMAL LOW
Hemoglobin: 12.9 — ABNORMAL LOW
TCO2: 18

## 2011-06-28 LAB — CBC
HCT: 29.5 — ABNORMAL LOW
HCT: 39.8
HCT: 20.9 — ABNORMAL LOW
HCT: 25.5 — ABNORMAL LOW
Hemoglobin: 10.2 — ABNORMAL LOW
Hemoglobin: 9.1 — ABNORMAL LOW
MCHC: 35.7
MCHC: 34.3
MCV: 96.2
MCV: 94.3
MCV: 94.4
Platelets: 161
Platelets: 225
Platelets: 139 — ABNORMAL LOW
Platelets: 143 — ABNORMAL LOW
RBC: 2.47 — ABNORMAL LOW
RBC: 3.07 — ABNORMAL LOW
RBC: 2.17 — ABNORMAL LOW
RBC: 2.7 — ABNORMAL LOW
RBC: 2.81 — ABNORMAL LOW
WBC: 14.4 — ABNORMAL HIGH
WBC: 15.1 — ABNORMAL HIGH
WBC: 12.2 — ABNORMAL HIGH
WBC: 13.2 — ABNORMAL HIGH

## 2011-06-28 LAB — BLOOD GAS, ARTERIAL
Acid-base deficit: 6.7 — ABNORMAL HIGH
Drawn by: 29757
O2 Content: 2
pCO2 arterial: 28.7 — ABNORMAL LOW
pH, Arterial: 7.395
pO2, Arterial: 115 — ABNORMAL HIGH

## 2011-06-28 LAB — BASIC METABOLIC PANEL
BUN: 26 — ABNORMAL HIGH
CO2: 24
CO2: 25
Chloride: 113 — ABNORMAL HIGH
Chloride: 105
Creatinine, Ser: 1.6 — ABNORMAL HIGH
GFR calc Af Amer: 56 — ABNORMAL LOW
GFR calc Af Amer: >60
GFR calc non Af Amer: 47 — ABNORMAL LOW
GFR calc non Af Amer: 53 — ABNORMAL LOW
Glucose, Bld: 115 — ABNORMAL HIGH
Potassium: 4.1
Sodium: 138

## 2011-06-28 LAB — DIFFERENTIAL
Basophils Absolute: 0
Basophils Relative: 0
Eosinophils Absolute: 0
Eosinophils Relative: 0
Monocytes Absolute: 0.9
Monocytes Relative: 6
Neutro Abs: 13.1 — ABNORMAL HIGH

## 2011-06-28 LAB — COMPREHENSIVE METABOLIC PANEL
ALT: 18
AST: 30
AST: 157 — ABNORMAL HIGH
Albumin: 3.1 — ABNORMAL LOW
Albumin: 2.2 — ABNORMAL LOW
Alkaline Phosphatase: 50
Alkaline Phosphatase: 32 — ABNORMAL LOW
BUN: 12
BUN: 22
CO2: 23
Chloride: 109
Chloride: 109
GFR calc Af Amer: 46 — ABNORMAL LOW
Potassium: 4.6
Potassium: 3.6
Sodium: 141
Total Bilirubin: 1.1
Total Bilirubin: 0.7

## 2011-06-28 LAB — CROSSMATCH

## 2011-06-28 LAB — URINALYSIS, ROUTINE W REFLEX MICROSCOPIC
Bilirubin Urine: NEGATIVE
Glucose, UA: 250 — AB
Ketones, ur: NEGATIVE
Leukocytes, UA: NEGATIVE
pH: 6

## 2011-06-28 LAB — POCT I-STAT 3, ART BLOOD GAS (G3+)
Acid-base deficit: 4 — ABNORMAL HIGH
Bicarbonate: 20.1
O2 Saturation: 93
Patient temperature: 98.3
TCO2: 21

## 2011-06-28 LAB — URINE MICROSCOPIC-ADD ON

## 2011-06-28 LAB — AMYLASE: Amylase: 185 — ABNORMAL HIGH

## 2011-06-28 LAB — APTT: aPTT: 31

## 2011-06-29 LAB — COMPREHENSIVE METABOLIC PANEL
ALT: 17
Alkaline Phosphatase: 62
Glucose, Bld: 132 — ABNORMAL HIGH
Potassium: 5.1
Sodium: 138
Total Protein: 6.9

## 2011-06-29 LAB — CARDIAC PANEL(CRET KIN+CKTOT+MB+TROPI)
CK, MB: 1.9
Relative Index: 1.8
Total CK: 105

## 2011-06-29 LAB — CBC
HCT: 32.4 — ABNORMAL LOW
Hemoglobin: 12.3 — ABNORMAL LOW
MCHC: 34
MCV: 90.7
RBC: 3.55 — ABNORMAL LOW
RBC: 3.57 — ABNORMAL LOW
RBC: 4 — ABNORMAL LOW
RDW: 14.2
WBC: 6.3
WBC: 6.5

## 2011-06-29 LAB — URINALYSIS, ROUTINE W REFLEX MICROSCOPIC
Ketones, ur: NEGATIVE
Leukocytes, UA: NEGATIVE
Nitrite: NEGATIVE
Specific Gravity, Urine: 1.015
Urobilinogen, UA: 0.2
pH: 7

## 2011-06-29 LAB — RENAL FUNCTION PANEL
CO2: 23
GFR calc Af Amer: 14 — ABNORMAL LOW
GFR calc non Af Amer: 11 — ABNORMAL LOW
Glucose, Bld: 95
Phosphorus: 4.6
Potassium: 4.3
Sodium: 139

## 2011-06-29 LAB — PROTEIN ELECTROPH W RFLX QUANT IMMUNOGLOBULINS
Alpha-1-Globulin: 6.1 — ABNORMAL HIGH
Alpha-2-Globulin: 9.8
Beta 2: 3.9
Beta Globulin: 5.5
Gamma Globulin: 19.1 — ABNORMAL HIGH
M-Spike, %: NOT DETECTED

## 2011-06-29 LAB — BASIC METABOLIC PANEL
BUN: 59 — ABNORMAL HIGH
CO2: 23
Chloride: 106
Creatinine, Ser: 4.86 — ABNORMAL HIGH
Glucose, Bld: 98
Potassium: 4.8

## 2011-06-29 LAB — DIFFERENTIAL
Basophils Relative: 0
Eosinophils Absolute: 0.4
Eosinophils Relative: 5
Lymphs Abs: 1.8
Monocytes Absolute: 0.8
Monocytes Relative: 10
Monocytes Relative: 12
Neutro Abs: 3.1
Neutrophils Relative %: 48
Neutrophils Relative %: 68

## 2011-06-29 LAB — B-NATRIURETIC PEPTIDE (CONVERTED LAB): Pro B Natriuretic peptide (BNP): 2000 — ABNORMAL HIGH

## 2011-06-29 LAB — URINE MICROSCOPIC-ADD ON

## 2011-06-29 LAB — SODIUM, URINE, RANDOM: Sodium, Ur: 112

## 2011-07-02 LAB — POCT I-STAT 4, (NA,K, GLUC, HGB,HCT)
Glucose, Bld: 113 — ABNORMAL HIGH
HCT: 34 — ABNORMAL LOW
Hemoglobin: 11.6 — ABNORMAL LOW
Hemoglobin: 11.9 — ABNORMAL LOW
Hemoglobin: 11.6 — ABNORMAL LOW
Potassium: 3.5
Potassium: 3.8
Sodium: 133 — ABNORMAL LOW
Sodium: 137

## 2011-07-02 LAB — POCT I-STAT, CHEM 8
BUN: 13
Calcium, Ion: 1.18
HCT: 33 — ABNORMAL LOW
Hemoglobin: 11.2 — ABNORMAL LOW
Sodium: 138
TCO2: 36

## 2011-07-02 LAB — HEMOGLOBIN A1C
Hgb A1c MFr Bld: 5
Mean Plasma Glucose: 97

## 2011-07-02 LAB — RENAL FUNCTION PANEL
Albumin: 3.8
Calcium: 10.3
Glucose, Bld: 133 — ABNORMAL HIGH
Phosphorus: 5.3 — ABNORMAL HIGH
Potassium: 3.7
Sodium: 135

## 2012-01-07 ENCOUNTER — Other Ambulatory Visit: Payer: Self-pay | Admitting: Nephrology

## 2012-01-07 DIAGNOSIS — N281 Cyst of kidney, acquired: Secondary | ICD-10-CM

## 2012-01-09 ENCOUNTER — Ambulatory Visit
Admission: RE | Admit: 2012-01-09 | Discharge: 2012-01-09 | Disposition: A | Discharge status: Home / Self Care | Payer: Medicare Other | Source: Ambulatory Visit | Attending: Nephrology | Admitting: Nephrology

## 2012-01-09 DIAGNOSIS — N281 Cyst of kidney, acquired: Secondary | ICD-10-CM

## 2012-05-14 ENCOUNTER — Other Ambulatory Visit: Payer: Self-pay | Admitting: *Deleted

## 2012-05-14 DIAGNOSIS — Z48812 Encounter for surgical aftercare following surgery on the circulatory system: Secondary | ICD-10-CM

## 2012-05-14 DIAGNOSIS — I714 Abdominal aortic aneurysm, without rupture: Secondary | ICD-10-CM

## 2012-05-16 ENCOUNTER — Encounter: Payer: Self-pay | Admitting: Neurosurgery

## 2012-05-19 ENCOUNTER — Ambulatory Visit: Payer: Medicare Other | Admitting: Surgery

## 2012-05-19 ENCOUNTER — Encounter: Payer: Self-pay | Admitting: Neurosurgery

## 2012-05-19 ENCOUNTER — Ambulatory Visit (INDEPENDENT_AMBULATORY_CARE_PROVIDER_SITE_OTHER): Payer: Medicare Other | Admitting: Neurosurgery

## 2012-05-19 ENCOUNTER — Encounter (INDEPENDENT_AMBULATORY_CARE_PROVIDER_SITE_OTHER): Payer: Medicare Other | Admitting: *Deleted

## 2012-05-19 VITALS — BP 136/69 | HR 58 | Resp 16 | Ht 70.0 in | Wt 193.5 lb

## 2012-05-19 DIAGNOSIS — I714 Abdominal aortic aneurysm, without rupture, unspecified: Secondary | ICD-10-CM | POA: Insufficient documentation

## 2012-05-19 DIAGNOSIS — Z48812 Encounter for surgical aftercare following surgery on the circulatory system: Secondary | ICD-10-CM

## 2012-05-19 NOTE — Addendum Note (Signed)
Addended by: Sharee Pimple on: 05/19/2012 12:47 PM   Modules accepted: Orders

## 2012-05-19 NOTE — Progress Notes (Signed)
[facility] Office Note  CC: AAA repair surveillance Referring Physician: Brabham  History of Present Illness: 78 year old male patient of Dr. Myra Gianotti status post Gore extruder repair of a ruptured abdominal aneurysm in 2009. The patient denies any abdominal or back pain, the patient denies any other vascular difficulties. The patient also denies any new medical diagnoses or recent surgery.  Past Medical History  Diagnosis Date  . Cancer     Prostate cancer  . AAA (abdominal aortic aneurysm) 2009    Ruptured AAA  . Hyperlipidemia   . Arthritis   . Chronic kidney disease   . Hypertension   . History of thromboembolism   . Claudication     ROS: [x]  Positive   [ ]  Denies    General: [ ]  Weight loss, [ ]  Fever, [ ]  chills Neurologic: [ ]  Dizziness, [ ]  Blackouts, [ ]  Seizure [ ]  Stroke, [ ]  "Mini stroke", [ ]  Slurred speech, [ ]  Temporary blindness; [ ]  weakness in arms or legs, [ ]  Hoarseness Cardiac: [ ]  Chest pain/pressure, [ ]  Shortness of breath at rest [ ]  Shortness of breath with exertion, [ ]  Atrial fibrillation or irregular heartbeat Vascular: [ ]  Pain in legs with walking, [ ]  Pain in legs at rest, [ ]  Pain in legs at night,  [ ]  Non-healing ulcer, [ ]  Blood clot in vein/DVT,   Pulmonary: [ ]  Home oxygen, [ ]  Productive cough, [ ]  Coughing up blood, [ ]  Asthma,  [ ]  Wheezing Musculoskeletal:  [ ]  Arthritis, [ ]  Low back pain, [ ]  Joint pain Hematologic: [ ]  Easy Bruising, [ ]  Anemia; [ ]  Hepatitis Gastrointestinal: [ ]  Blood in stool, [ ]  Gastroesophageal Reflux/heartburn, [ ]  Trouble swallowing Urinary: [ ]  chronic Kidney disease, [ ]  on HD - [ ]  MWF or [ ]  TTHS, [ ]  Burning with urination, [ ]  Difficulty urinating Skin: [ ]  Rashes, [ ]  Wounds Psychological: [ ]  Anxiety, [ ]  Depression   Social History History  Substance Use Topics  . Smoking status: Former Smoker    Quit date: 10/01/1976  . Smokeless tobacco: Not on file    . Alcohol Use: 3.0 oz/week    5 Shots of liquor per week    Family History Family History  Problem Relation Age of Onset  . Heart attack Father     Allergies  Allergen Reactions  . Erythromycin     Pt states "ALL MYCINS"    Current Outpatient Prescriptions  Medication Sig Dispense Refill  . aspirin 81 MG tablet Take 81 mg by mouth daily.        Marland Kitchen atenolol (TENORMIN) 50 MG tablet Take 50 mg by mouth daily.        . cinacalcet (SENSIPAR) 30 MG tablet Take 30 mg by mouth daily.        . fish oil-omega-3 fatty acids 1000 MG capsule Take 1 g by mouth daily.        Marland Kitchen FOSRENOL 1000 MG chewable tablet as needed.      Marland Kitchen omeprazole (PRILOSEC) 10 MG capsule Take 10 mg by mouth daily.        Marland Kitchen warfarin (COUMADIN) 5 MG tablet Take 5 mg by mouth as directed.        . niacin-lovastatin (ADVICOR) 500-20 MG 24 hr tablet Take 1 tablet by mouth at bedtime.        Marland Kitchen oxyCODONE (OXY IR/ROXICODONE) 5 MG immediate release tablet  Physical Examination  Filed Vitals:   05/19/12 0926  BP: 136/69  Pulse: 58  Resp: 16    Body mass index is 27.76 kg/(m^2).  General:  WDWN in NAD Gait: Normal HEENT: WNL Eyes: Pupils equal Pulmonary: normal non-labored breathing , without Rales, rhonchi,  wheezing Cardiac: RRR, without  Murmurs, rubs or gallops; No carotid bruits Abdomen: soft, NT, no masses Skin: no rashes, ulcers noted Vascular Exam/Pulses: Palpable femoral and lower extremity pulses bilaterally no abdominal mass is palpated  Extremities without ischemic changes, no Gangrene , no cellulitis; no open wounds;  Musculoskeletal: no muscle wasting or atrophy  Neurologic: A&O X 3; Appropriate Affect ; SENSATION: normal; MOTOR FUNCTION:  moving all extremities equally. Speech is fluent/normal  Non-Invasive Vascular Imaging: AAA duplex shows a maximum diameter of 4.5 which is unchanged from previous CT performed August 2012. No endoleak is detected  ASSESSMENT/PLAN: Asymptomatic patient  status post AAA repair 2009. The patient will followup in one year with repeat AAA duplex. The patient's questions were encouraged and answered, he is in agreement with this plan.  Lauree Chandler ANP  Clinic M.D.: Myra Gianotti

## 2012-06-05 ENCOUNTER — Other Ambulatory Visit (HOSPITAL_COMMUNITY): Payer: Self-pay | Admitting: Nephrology

## 2012-06-05 DIAGNOSIS — N186 End stage renal disease: Secondary | ICD-10-CM

## 2012-06-06 ENCOUNTER — Other Ambulatory Visit (HOSPITAL_COMMUNITY): Payer: Self-pay | Admitting: Nephrology

## 2012-06-06 ENCOUNTER — Ambulatory Visit (HOSPITAL_COMMUNITY)
Admission: RE | Admit: 2012-06-06 | Discharge: 2012-06-06 | Disposition: A | Discharge status: Home / Self Care | Payer: Medicare Other | Source: Ambulatory Visit | Attending: Nephrology | Admitting: Nephrology

## 2012-06-06 DIAGNOSIS — Z992 Dependence on renal dialysis: Secondary | ICD-10-CM | POA: Insufficient documentation

## 2012-06-06 DIAGNOSIS — T82898A Other specified complication of vascular prosthetic devices, implants and grafts, initial encounter: Secondary | ICD-10-CM | POA: Insufficient documentation

## 2012-06-06 DIAGNOSIS — I120 Hypertensive chronic kidney disease with stage 5 chronic kidney disease or end stage renal disease: Secondary | ICD-10-CM | POA: Insufficient documentation

## 2012-06-06 DIAGNOSIS — I871 Compression of vein: Secondary | ICD-10-CM | POA: Insufficient documentation

## 2012-06-06 DIAGNOSIS — N186 End stage renal disease: Secondary | ICD-10-CM

## 2012-06-06 DIAGNOSIS — Y832 Surgical operation with anastomosis, bypass or graft as the cause of abnormal reaction of the patient, or of later complication, without mention of misadventure at the time of the procedure: Secondary | ICD-10-CM | POA: Insufficient documentation

## 2012-06-06 MED ORDER — IOHEXOL 300 MG/ML  SOLN
100.0000 mL | Freq: Once | INTRAMUSCULAR | Status: AC | PRN
  Administered 2012-06-06: 50 mL via INTRAVENOUS

## 2012-06-06 NOTE — H&P (Signed)
[name] is an 78 y.o. male.   Chief Complaint: poor flow rate in left arm dialysis graft HPI: Pt with ESRD and decreased arterial flow rates in left upper arm AVG. Shuntogram today reveals stenosis and he presents now for angioplasty with possible stenting.   Past Medical History  Diagnosis Date  . Cancer     Prostate cancer  . AAA (abdominal aortic aneurysm) 2009    Ruptured AAA  . Hyperlipidemia   . Arthritis   . Chronic kidney disease   . Hypertension   . History of thromboembolism   . Claudication     Past Surgical History  Procedure Date  . Abdominal aortic aneurysm repair 03/30/2008    Ruptured AAA  . Thrombectomy / arteriovenous graft revision     Numerous revision/ declots of Left Forearm AVG  . Av fistula placement 04/23/11    Left arm revision AVG     Family History  Problem Relation Age of Onset  . Heart attack Father    Social History:  reports that he quit smoking about 35 years ago. He does not have any smokeless tobacco history on file. He reports that he drinks about 3 ounces of alcohol per week. He reports that he does not use illicit drugs.  Allergies:  Allergies  Allergen Reactions  . Erythromycin     Pt states "ALL MYCINS"    Current outpatient prescriptions:aspirin 81 MG tablet, Take 81 mg by mouth daily.  , Disp: , Rfl: ;  atenolol (TENORMIN) 50 MG tablet, Take 50 mg by mouth daily.  , Disp: , Rfl: ;  cinacalcet (SENSIPAR) 30 MG tablet, Take 30 mg by mouth daily.  , Disp: , Rfl: ;  fish oil-omega-3 fatty acids 1000 MG capsule, Take 1 g by mouth daily.  , Disp: , Rfl: ;  FOSRENOL 1000 MG chewable tablet, as needed., Disp: , Rfl:  niacin-lovastatin (ADVICOR) 500-20 MG 24 hr tablet, Take 1 tablet by mouth at bedtime.  , Disp: , Rfl: ;  omeprazole (PRILOSEC) 10 MG capsule, Take 10 mg by mouth daily.  , Disp: , Rfl: ;  oxyCODONE (OXY IR/ROXICODONE) 5 MG immediate release tablet, , Disp: , Rfl: ;  warfarin (COUMADIN) 5 MG tablet, Take 5 mg by mouth as  directed.  , Disp: , Rfl:   No results found for this or any previous visit (from the past 48 hour(s)). No results found.  Review of Systems  Constitutional: Negative for fever and chills.  Respiratory: Negative for cough and shortness of breath.   Cardiovascular: Negative for chest pain.  Gastrointestinal: Negative for nausea, vomiting and abdominal pain.  Musculoskeletal: Negative for back pain.  Neurological: Negative for headaches.    There were no vitals taken for this visit. Physical Exam  Constitutional: He is oriented to person, place, and time. He appears well-developed and well-nourished.  Cardiovascular: Normal rate and regular rhythm.        Left upper arm AVG  Respiratory: Effort normal and breath sounds normal.  GI: Soft. Bowel sounds are normal. There is no tenderness.  Musculoskeletal: Normal range of motion. He exhibits no edema.  Neurological: He is alert and oriented to person, place, and time.     Assessment/Plan Pt with ESRD and stenotic left upper arm AVG; plan is for angioplasty/possible stenting of graft. Pt aware of plans and consent signed.  [name],D KEVIN 06/06/2012, 3:01 PM

## 2012-06-06 NOTE — Procedures (Signed)
Interventional Radiology Procedure Note  Procedure: Left shuntogram and PTA of stenoses Complications: None Recommendations: - Resume dialysis  Signed,  Sterling Big, MD Vascular & Interventional Radiologist [facility] Radiology

## 2012-06-06 NOTE — H&P (Signed)
Agree with above.  Will proceed with PTA of venous anastomosis and distal graft.  Signed,  Sterling Big, MD Vascular & Interventional Radiologist [facility] Radiology

## 2012-06-09 ENCOUNTER — Telehealth (HOSPITAL_COMMUNITY): Payer: Self-pay | Admitting: *Deleted

## 2012-06-09 NOTE — Telephone Encounter (Signed)
No problems or concerns following procedure.

## 2012-06-11 ENCOUNTER — Ambulatory Visit (HOSPITAL_COMMUNITY): Payer: Medicare Other

## 2012-06-16 ENCOUNTER — Other Ambulatory Visit: Payer: Self-pay | Admitting: Family Medicine

## 2012-12-03 ENCOUNTER — Other Ambulatory Visit (HOSPITAL_COMMUNITY): Payer: Self-pay | Admitting: Nephrology

## 2012-12-03 DIAGNOSIS — N186 End stage renal disease: Secondary | ICD-10-CM

## 2012-12-05 ENCOUNTER — Ambulatory Visit (HOSPITAL_COMMUNITY)
Admission: RE | Admit: 2012-12-05 | Discharge: 2012-12-05 | Disposition: A | Discharge status: Home / Self Care | Payer: Medicare Other | Source: Ambulatory Visit | Attending: Nephrology | Admitting: Nephrology

## 2012-12-06 ENCOUNTER — Encounter (HOSPITAL_COMMUNITY): Payer: Self-pay

## 2012-12-06 ENCOUNTER — Ambulatory Visit (HOSPITAL_COMMUNITY)
Admission: RE | Admit: 2012-12-06 | Discharge: 2012-12-06 | Disposition: A | Discharge status: Home / Self Care | Payer: Medicare Other | Source: Ambulatory Visit | Attending: Nephrology | Admitting: Nephrology

## 2012-12-06 ENCOUNTER — Other Ambulatory Visit (HOSPITAL_COMMUNITY): Payer: Self-pay | Admitting: Nephrology

## 2012-12-06 DIAGNOSIS — N186 End stage renal disease: Secondary | ICD-10-CM

## 2012-12-06 DIAGNOSIS — I871 Compression of vein: Secondary | ICD-10-CM | POA: Insufficient documentation

## 2012-12-06 DIAGNOSIS — I120 Hypertensive chronic kidney disease with stage 5 chronic kidney disease or end stage renal disease: Secondary | ICD-10-CM | POA: Insufficient documentation

## 2012-12-06 DIAGNOSIS — I82609 Acute embolism and thrombosis of unspecified veins of unspecified upper extremity: Secondary | ICD-10-CM | POA: Insufficient documentation

## 2012-12-06 DIAGNOSIS — T82898A Other specified complication of vascular prosthetic devices, implants and grafts, initial encounter: Secondary | ICD-10-CM | POA: Insufficient documentation

## 2012-12-06 DIAGNOSIS — Z992 Dependence on renal dialysis: Secondary | ICD-10-CM | POA: Insufficient documentation

## 2012-12-06 DIAGNOSIS — Z8546 Personal history of malignant neoplasm of prostate: Secondary | ICD-10-CM | POA: Insufficient documentation

## 2012-12-06 DIAGNOSIS — Y832 Surgical operation with anastomosis, bypass or graft as the cause of abnormal reaction of the patient, or of later complication, without mention of misadventure at the time of the procedure: Secondary | ICD-10-CM | POA: Insufficient documentation

## 2012-12-06 LAB — POTASSIUM: Potassium: 3.3 mEq/L — ABNORMAL LOW (ref 3.5–5.1)

## 2012-12-06 MED ORDER — MIDAZOLAM HCL 2 MG/2ML IJ SOLN
INTRAMUSCULAR | Status: AC | PRN
  Administered 2012-12-06: 0.5 mg via INTRAVENOUS
  Administered 2012-12-06: 1 mg via INTRAVENOUS

## 2012-12-06 MED ORDER — HEPARIN SODIUM (PORCINE) 1000 UNIT/ML IJ SOLN
INTRAMUSCULAR | Status: AC
  Administered 2012-12-06: 3000 [IU]
  Filled 2012-12-06: qty 1

## 2012-12-06 MED ORDER — ALTEPLASE 100 MG IV SOLR
4.0000 mg | Freq: Once | INTRAVENOUS | Status: AC
  Administered 2012-12-06: 4 mg
  Filled 2012-12-06: qty 4

## 2012-12-06 MED ORDER — IOHEXOL 300 MG/ML  SOLN
100.0000 mL | Freq: Once | INTRAMUSCULAR | Status: AC | PRN
  Administered 2012-12-06: 60 mL via INTRAVENOUS

## 2012-12-06 MED ORDER — FENTANYL CITRATE 0.05 MG/ML IJ SOLN
INTRAMUSCULAR | Status: AC
  Filled 2012-12-06: qty 2

## 2012-12-06 MED ORDER — FENTANYL CITRATE 0.05 MG/ML IJ SOLN
INTRAMUSCULAR | Status: AC | PRN
  Administered 2012-12-06: 50 ug via INTRAVENOUS
  Administered 2012-12-06: 25 ug via INTRAVENOUS

## 2012-12-06 MED ORDER — MIDAZOLAM HCL 2 MG/2ML IJ SOLN
INTRAMUSCULAR | Status: AC
  Filled 2012-12-06: qty 2

## 2012-12-06 NOTE — H&P (Signed)
  HPI: [name] is an 79 y.o. male with ESRD and a left UE AVG. He has had declot and PTA in past and now has thrombosed again. Last HD was 3/4, feeling ok. PMHx and meds reviewed.  Past Medical History:  Past Medical History  Diagnosis Date  . Cancer     Prostate cancer  . AAA (abdominal aortic aneurysm) 2009    Ruptured AAA  . Hyperlipidemia   . Arthritis   . Chronic kidney disease   . Hypertension   . History of thromboembolism   . Claudication     Past Surgical History:  Past Surgical History  Procedure Laterality Date  . Abdominal aortic aneurysm repair  03/30/2008    Ruptured AAA  . Thrombectomy / arteriovenous graft revision      Numerous revision/ declots of Left Forearm AVG  . Av fistula placement  04/23/11    Left arm revision AVG     Family History:  Family History  Problem Relation Age of Onset  . Heart attack Father     Social History:  reports that he quit smoking about 36 years ago. He does not have any smokeless tobacco history on file. He reports that he drinks about 3.0 ounces of alcohol per week. He reports that he does not use illicit drugs.  Allergies:  Allergies  Allergen Reactions  . Erythromycin     Pt states "ALL MYCINS"    Medications: Current Outpatient Prescriptions  .  aspirin 81 MG tablet  Take 81 mg by mouth daily.  Marland Kitchen  atenolol (TENORMIN) 50 MG tablet  Take 50 mg by mouth daily.  .  cinacalcet (SENSIPAR) 30 MG tablet  Take 30 mg by mouth daily.  .  fish oil-omega-3 fatty acids 1000 MG capsule  Take 1 g by mouth daily.  Marland Kitchen  FOSRENOL 1000 MG chewable tablet  as needed.  Marland Kitchen  omeprazole (PRILOSEC) 10 MG capsule  Take 10 mg by mouth daily.  Marland Kitchen  warfarin (COUMADIN) 5 MG tablet  Take 5 mg by mouth as directed.  .  niacin-lovastatin (ADVICOR) 500-20 MG 24 hr tablet  Take 1 tablet by mouth at bedtime.  Marland Kitchen  oxyCODONE (OXY IR/ROXICODONE) 5 MG   Please HPI for pertinent positives, otherwise complete 10 system ROS  negative.  Physical Exam: There were no vitals taken for this visit. There is no weight on file to calculate BMI.   General Appearance:  Alert, cooperative, no distress, appears stated age  Head:  Normocephalic, without obvious abnormality, atraumatic  ENT: Unremarkable  Neck: Supple, symmetrical, trachea midline, no adenopathy, thyroid: not enlarged, symmetric, no tenderness/mass/nodules  Lungs:   Clear to auscultation bilaterally, no w/r/r, respirations unlabored without use of accessory muscles.  Chest Wall:  No tenderness or deformity  Heart:  Regular rate and rhythm, S1, S2 normal, no murmur, rub or gallop. Carotids 2+ without bruit.  Extremities: (L)UE AVG without thrill/bruit, positive brachial pulse, hand warm   No results found for this or any previous visit (from the past 48 hour(s)). No results found.  Assessment/Plan Clotted (L)UE AVG For thrombolysis/thrombectomy, possible angioplasty, poss HD catheter if not successful. Consent signed in chart  Brayton El PA-C 12/06/2012, 8:46 AM

## 2012-12-06 NOTE — Procedures (Signed)
Technically successful declot of left upper arm graft.  No immediate post procedural complications. 

## 2013-05-19 ENCOUNTER — Ambulatory Visit: Payer: Medicare Other | Admitting: Neurosurgery

## 2014-12-27 ENCOUNTER — Other Ambulatory Visit: Payer: Self-pay | Admitting: Nephrology

## 2014-12-27 ENCOUNTER — Ambulatory Visit
Admission: RE | Admit: 2014-12-27 | Discharge: 2014-12-27 | Disposition: A | Discharge status: Home / Self Care | Payer: Medicare Other | Source: Ambulatory Visit | Attending: Nephrology | Admitting: Nephrology

## 2014-12-27 DIAGNOSIS — R05 Cough: Secondary | ICD-10-CM

## 2014-12-27 DIAGNOSIS — R058 Other specified cough: Secondary | ICD-10-CM

## 2014-12-27 DIAGNOSIS — R0602 Shortness of breath: Secondary | ICD-10-CM

## 2015-10-25 ENCOUNTER — Encounter (HOSPITAL_COMMUNITY): Payer: Self-pay | Admitting: Emergency Medicine

## 2015-10-25 ENCOUNTER — Emergency Department (HOSPITAL_COMMUNITY): Payer: Medicare Other

## 2015-10-25 ENCOUNTER — Emergency Department (HOSPITAL_COMMUNITY)
Admission: EM | Admit: 2015-10-25 | Discharge: 2015-10-25 | Disposition: A | Discharge status: Home / Self Care | Payer: Medicare Other | Attending: Emergency Medicine | Admitting: Emergency Medicine

## 2015-10-25 DIAGNOSIS — R51 Headache: Secondary | ICD-10-CM | POA: Diagnosis not present

## 2015-10-25 DIAGNOSIS — N186 End stage renal disease: Secondary | ICD-10-CM | POA: Insufficient documentation

## 2015-10-25 DIAGNOSIS — I120 Hypertensive chronic kidney disease with stage 5 chronic kidney disease or end stage renal disease: Secondary | ICD-10-CM | POA: Insufficient documentation

## 2015-10-25 DIAGNOSIS — Z8546 Personal history of malignant neoplasm of prostate: Secondary | ICD-10-CM | POA: Diagnosis not present

## 2015-10-25 DIAGNOSIS — R42 Dizziness and giddiness: Secondary | ICD-10-CM | POA: Diagnosis present

## 2015-10-25 DIAGNOSIS — Z992 Dependence on renal dialysis: Secondary | ICD-10-CM | POA: Insufficient documentation

## 2015-10-25 DIAGNOSIS — Z8739 Personal history of other diseases of the musculoskeletal system and connective tissue: Secondary | ICD-10-CM | POA: Diagnosis not present

## 2015-10-25 DIAGNOSIS — R519 Headache, unspecified: Secondary | ICD-10-CM

## 2015-10-25 HISTORY — DX: Dependence on renal dialysis: Z99.2

## 2015-10-25 LAB — GRAM STAIN: SPECIAL REQUESTS: NORMAL

## 2015-10-25 LAB — CSF CELL COUNT WITH DIFFERENTIAL
RBC Count, CSF: 1 /mm3 — ABNORMAL HIGH
RBC Count, CSF: 92 /mm3 — ABNORMAL HIGH
TUBE #: 4
Tube #: 1
WBC CSF: 0 /mm3 (ref 0–5)
WBC, CSF: 1 /mm3 (ref 0–5)

## 2015-10-25 LAB — COMPREHENSIVE METABOLIC PANEL
ALK PHOS: 148 U/L — AB (ref 38–126)
ALT: 12 U/L — AB (ref 17–63)
ANION GAP: 19 — AB (ref 5–15)
AST: 21 U/L (ref 15–41)
Albumin: 3.5 g/dL (ref 3.5–5.0)
BILIRUBIN TOTAL: 0.5 mg/dL (ref 0.3–1.2)
BUN: 45 mg/dL — ABNORMAL HIGH (ref 6–20)
CALCIUM: 10.3 mg/dL (ref 8.9–10.3)
CO2: 27 mmol/L (ref 22–32)
CREATININE: 5.33 mg/dL — AB (ref 0.61–1.24)
Chloride: 95 mmol/L — ABNORMAL LOW (ref 101–111)
GFR, EST AFRICAN AMERICAN: 11 mL/min — AB (ref >60)
GFR, EST NON AFRICAN AMERICAN: 9 mL/min — AB (ref >60)
Glucose, Bld: 113 mg/dL — ABNORMAL HIGH (ref 65–99)
Potassium: 4.2 mmol/L (ref 3.5–5.1)
Sodium: 141 mmol/L (ref 135–145)
TOTAL PROTEIN: 6.7 g/dL (ref 6.5–8.1)

## 2015-10-25 LAB — URINE MICROSCOPIC-ADD ON

## 2015-10-25 LAB — CBC WITH DIFFERENTIAL/PLATELET
BASOS ABS: 0 10*3/uL (ref 0.0–0.1)
Basophils Relative: 0 %
Eosinophils Absolute: 0.3 10*3/uL (ref 0.0–0.7)
Eosinophils Relative: 3 %
HEMATOCRIT: 35.8 % — AB (ref 39.0–52.0)
HEMOGLOBIN: 11.8 g/dL — AB (ref 13.0–17.0)
LYMPHS PCT: 12 %
Lymphs Abs: 1.4 10*3/uL (ref 0.7–4.0)
MCH: 31.1 pg (ref 26.0–34.0)
MCHC: 33 g/dL (ref 30.0–36.0)
MCV: 94.2 fL (ref 78.0–100.0)
Monocytes Absolute: 1.5 10*3/uL — ABNORMAL HIGH (ref 0.1–1.0)
Monocytes Relative: 12 %
NEUTROS ABS: 8.7 10*3/uL — AB (ref 1.7–7.7)
Neutrophils Relative %: 73 %
PLATELETS: 280 10*3/uL (ref 150–400)
RBC: 3.8 MIL/uL — AB (ref 4.22–5.81)
RDW: 13.4 % (ref 11.5–15.5)
WBC: 11.9 10*3/uL — ABNORMAL HIGH (ref 4.0–10.5)

## 2015-10-25 LAB — URINALYSIS, ROUTINE W REFLEX MICROSCOPIC
BILIRUBIN URINE: NEGATIVE
Glucose, UA: 100 mg/dL — AB
KETONES UR: NEGATIVE mg/dL
LEUKOCYTES UA: NEGATIVE
NITRITE: NEGATIVE
PH: 8.5 — AB (ref 5.0–8.0)
PROTEIN: 100 mg/dL — AB
Specific Gravity, Urine: 1.011 (ref 1.005–1.030)

## 2015-10-25 LAB — PROTIME-INR
INR: 1.04 (ref 0.00–1.49)
PROTHROMBIN TIME: 13.8 s (ref 11.6–15.2)

## 2015-10-25 LAB — PROTEIN, CSF: TOTAL PROTEIN, CSF: 56 mg/dL — AB (ref 15–45)

## 2015-10-25 LAB — GLUCOSE, CSF: Glucose, CSF: 56 mg/dL (ref 40–70)

## 2015-10-25 MED ORDER — ACETAMINOPHEN 500 MG PO TABS
1000.0000 mg | ORAL_TABLET | Freq: Once | ORAL | Status: AC
  Administered 2015-10-25: 1000 mg via ORAL
  Filled 2015-10-25: qty 2

## 2015-10-25 MED ORDER — LIDOCAINE-EPINEPHRINE 1 %-1:100000 IJ SOLN
10.0000 mL | Freq: Once | INTRAMUSCULAR | Status: AC
  Administered 2015-10-25: 10 mL via INTRADERMAL
  Filled 2015-10-25: qty 1

## 2015-10-25 MED ORDER — METOCLOPRAMIDE HCL 10 MG PO TABS
10.0000 mg | ORAL_TABLET | Freq: Once | ORAL | Status: AC
  Administered 2015-10-25: 10 mg via ORAL
  Filled 2015-10-25: qty 1

## 2015-10-25 MED ORDER — DIPHENHYDRAMINE HCL 25 MG PO CAPS
25.0000 mg | ORAL_CAPSULE | Freq: Once | ORAL | Status: AC
  Administered 2015-10-25: 25 mg via ORAL
  Filled 2015-10-25: qty 1

## 2015-10-25 NOTE — ED Notes (Signed)
MD at bedside. 

## 2015-10-25 NOTE — ED Provider Notes (Signed)
Received care at [facility]. Please see Dr. Leta Speller note for prior care. Briefly this is an 82yo male with hx of AAA presenting with concern for severe HA. CT head WNL.  Awaiting LP, if negative may be discharged. No other neurologic concerns.   LP returned showing clearing of RBCs and is not consistent with SAH.  HA improved. Recommend close outpt follow up.   Gareth Morgan, MD 10/25/15 [phone]

## 2015-10-25 NOTE — ED Notes (Signed)
[name] [phone] Please call when needed... Card with info left on bedside table

## 2015-10-25 NOTE — ED Notes (Signed)
Pt. reports dizziness , lightheaded and occasional tremors onset last week , denies fever or chills /respirations unlabored . Hemodialysis q Tues/Thurs/Sat.

## 2015-10-25 NOTE — ED Provider Notes (Signed)
CSN: [id number]     Arrival date & time 10/25/15  0015 History   First MD Initiated Contact with Patient 10/25/15 [phone]     Chief Complaint  Patient presents with  . Dizziness     (Consider location/radiation/quality/duration/timing/severity/associated sxs/prior Treatment) HPI   [name] is a 82 y.o. male with past medical history of ruptured AAA status post repair, presenting today with severe headache. Patient states is going on for the past 3 or 4 days. He describes as sudden onset sharp headache that is right-sided. It has occurred while he has been driving and he has tremors in his bilateral lower extremities. He denies any neurological deficits. He states he never gets headache and this is abnormal for him. He denies any aneurysms in his brain. He currently is still having a headache. He did not take anything for pain control. Patient has no further complaints. He's had no fevers or recent infections.  10 Systems reviewed and are negative for acute change except as noted in the HPI.    Past Medical History  Diagnosis Date  . Cancer [facility])     Prostate cancer  . AAA (abdominal aortic aneurysm) ([place]) 2009    Ruptured AAA  . Hyperlipidemia   . Arthritis   . Chronic kidney disease   . Hypertension   . History of thromboembolism   . Claudication ([place])   . Hemodialysis patient [facility])    Past Surgical History  Procedure Laterality Date  . Abdominal aortic aneurysm repair  03/30/2008    Ruptured AAA  . Thrombectomy / arteriovenous graft revision      Numerous revision/ declots of Left Forearm AVG  . Av fistula placement  04/23/11    Left arm revision AVG    Family History  Problem Relation Age of Onset  . Heart attack Father    Social History  Substance Use Topics  . Smoking status: Former Smoker    Quit date: 10/01/1976  . Smokeless tobacco: None  . Alcohol Use: Yes    Review of Systems    Allergies  Erythromycin  Home Medications   Prior to Admission  medications   Medication Sig Start Date End Date Taking? Authorizing Provider  cinacalcet (SENSIPAR) 30 MG tablet Take 90 mg by mouth daily.    Yes Historical Provider, MD   BP 128/45 mmHg  Pulse 43  Temp(Src) 98.1 F (36.7 C) (Oral)  Resp 23  SpO2 92% Physical Exam  Constitutional: He is oriented to person, place, and time. Vital signs are normal. He appears well-developed and well-nourished.  Non-toxic appearance. He does not appear ill. No distress.  HENT:  Head: Normocephalic and atraumatic.  Nose: Nose normal.  Mouth/Throat: Oropharynx is clear and moist. No oropharyngeal exudate.  Eyes: Conjunctivae and EOM are normal. Pupils are equal, round, and reactive to light. No scleral icterus.  Neck: Normal range of motion. Neck supple. No tracheal deviation, no edema, no erythema and normal range of motion present. No thyroid mass and no thyromegaly present.  Cardiovascular: Normal rate, regular rhythm, S1 normal, S2 normal, normal heart sounds, intact distal pulses and normal pulses.  Exam reveals no gallop and no friction rub.   No murmur heard. Pulmonary/Chest: Effort normal and breath sounds normal. No respiratory distress. He has no wheezes. He has no rhonchi. He has no rales.  Abdominal: Soft. Normal appearance and bowel sounds are normal. He exhibits no distension, no ascites and no mass. There is no hepatosplenomegaly. There is no tenderness. There is  no rebound, no guarding and no CVA tenderness.  Musculoskeletal: Normal range of motion. He exhibits no edema or tenderness.  Left upper extremity AV fistula with palpable thrill.  Lymphadenopathy:    He has no cervical adenopathy.  Neurological: He is alert and oriented to person, place, and time. He has normal strength. No cranial nerve deficit or sensory deficit.  Normal strength and sensation in all activities. Normal cerebellar testing.  Skin: Skin is warm, dry and intact. No petechiae and no rash noted. He is not diaphoretic. No  erythema. No pallor.  Psychiatric: He has a normal mood and affect. His behavior is normal. Judgment normal.  Nursing note and vitals reviewed.   ED Course  Procedures (including critical care time) Labs Review Labs Reviewed  CBC WITH DIFFERENTIAL/PLATELET - Abnormal; Notable for the following:    WBC 11.9 (*)    RBC 3.80 (*)    Hemoglobin 11.8 (*)    HCT 35.8 (*)    Neutro Abs 8.7 (*)    Monocytes Absolute 1.5 (*)    All other components within normal limits  COMPREHENSIVE METABOLIC PANEL - Abnormal; Notable for the following:    Chloride 95 (*)    Glucose, Bld 113 (*)    BUN 45 (*)    Creatinine, Ser 5.33 (*)    ALT 12 (*)    Alkaline Phosphatase 148 (*)    GFR calc non Af Amer 9 (*)    GFR calc Af Amer 11 (*)    Anion gap 19 (*)    All other components within normal limits  URINALYSIS, ROUTINE W REFLEX MICROSCOPIC (NOT AT [facility]) - Abnormal; Notable for the following:    APPearance CLOUDY (*)    pH 8.5 (*)    Glucose, UA 100 (*)    Hgb urine dipstick SMALL (*)    Protein, ur 100 (*)    All other components within normal limits  URINE MICROSCOPIC-ADD ON - Abnormal; Notable for the following:    Squamous Epithelial / LPF 0-5 (*)    Bacteria, UA FEW (*)    All other components within normal limits  CSF CULTURE  GRAM STAIN  PROTIME-INR  CSF CELL COUNT WITH DIFFERENTIAL  GLUCOSE, CSF  PROTEIN, CSF  CSF CELL COUNT WITH DIFFERENTIAL    Imaging Review Dg Chest 2 View  10/25/2015  CLINICAL DATA:  Dizziness and head pain for 1 day. EXAM: CHEST  2 VIEW COMPARISON:  12/27/2014 FINDINGS: Normal heart size and pulmonary vascularity. Emphysematous changes in the lungs. Central peribronchial thickening suggesting chronic bronchitis. Calcified pleural plaques bilaterally. Calcified granuloma in the right lung. No focal airspace disease or consolidation. Calcified aorta. IMPRESSION: Emphysematous and chronic bronchitic changes in the lungs. Calcified pleural plaques. No evidence of  active pulmonary disease. Electronically Signed   By: Lucienne Capers M.D.   On: 10/25/2015 03:06   Ct Head Wo Contrast  10/25/2015  CLINICAL DATA:  Acute onset of dizziness, lightheadedness and tremors. Initial encounter. EXAM: CT HEAD WITHOUT CONTRAST TECHNIQUE: Contiguous axial images were obtained from the base of the skull through the vertex without intravenous contrast. COMPARISON:  None. FINDINGS: There is no evidence of acute infarction, mass lesion, or intra- or extra-axial hemorrhage on CT. Prominence of the ventricles and sulci reflects moderate cortical volume loss. Cerebellar atrophy is noted. Scattered periventricular and subcortical white matter change likely reflects small vessel ischemic microangiopathy. Small chronic lacunar infarcts are noted at the right basal ganglia. The brainstem and fourth ventricle are within  normal limits. The cerebral hemispheres demonstrate grossly normal gray-white differentiation. No mass effect or midline shift is seen. There is no evidence of fracture; visualized osseous structures are unremarkable in appearance. The orbits are within normal limits. The paranasal sinuses and mastoid air cells are well-aerated. No significant soft tissue abnormalities are seen. IMPRESSION: 1. No acute intracranial pathology seen on CT. 2. Moderate cortical volume loss and scattered small vessel ischemic microangiopathy. 3. Small chronic lacunar infarcts at the right basal ganglia. Electronically Signed   By: Garald Balding M.D.   On: 10/25/2015 03:17   I have personally reviewed and evaluated these images and lab results as part of my medical decision-making.   EKG Interpretation None      MDM   Final diagnoses:  None    Patient presents emergency department for sudden onset headache. I do not believe stroke is likely abscess shows normally do not cause pain in the head. Also his neurological exam is normal and he denies having any deficits at any time. He's had no  slurred speech or facial droop. I do have concern for possible subarachnoid hemorrhage. CT scan is negative. Lumbar puncture has been performed and results are pending. If negative patient will be safe for discharge. He was given Tylenol, Reglan, Benadryl for his headache. He states that his headache has improved upon repeat evaluation. Patient will be signed out to oncoming provider for follow-up of CSF results.  Everlene Balls, MD 10/25/15 [phone]

## 2015-10-25 NOTE — Discharge Instructions (Signed)
General Headache Without Cause [name], Continue to take tylenol as needed for your headache.  See your primary care doctor within 3 days for close follow up.  If symptoms worsen, come back to the ED immediately. Thank you. A headache is pain or discomfort felt around the head or neck area. There are many causes and types of headaches. In some cases, the cause may not be found.  HOME CARE  Managing Pain  Take over-the-counter and prescription medicines only as told by your doctor.  Lie down in a dark, quiet room when you have a headache.  If directed, apply ice to the head and neck area:  Put ice in a plastic bag.  Place a towel between your skin and the bag.  Leave the ice on for 20 minutes, 2-3 times per day.  Use a heating pad or hot shower to apply heat to the head and neck area as told by your doctor.  Keep lights dim if bright lights bother you or make your headaches worse. Eating and Drinking  Eat meals on a regular schedule.  Lessen how much alcohol you drink.  Lessen how much caffeine you drink, or stop drinking caffeine. General Instructions  Keep all follow-up visits as told by your doctor. This is important.  Keep a journal to find out if certain things bring on headaches. For example, write down:  What you eat and drink.  How much sleep you get.  Any change to your diet or medicines.  Relax by getting a massage or doing other relaxing activities.  Lessen stress.  Sit up straight. Do not tighten (tense) your muscles.  Do not use tobacco products. This includes cigarettes, chewing tobacco, or e-cigarettes. If you need help quitting, ask your doctor.  Exercise regularly as told by your doctor.  Get enough sleep. This often means 7-9 hours of sleep. GET HELP IF:  Your symptoms are not helped by medicine.  You have a headache that feels different than the other headaches.  You feel sick to your stomach (nauseous) or you throw up (vomit).  You  have a fever. GET HELP RIGHT AWAY IF:   Your headache becomes really bad.  You keep throwing up.  You have a stiff neck.  You have trouble seeing.  You have trouble speaking.  You have pain in the eye or ear.  Your muscles are weak or you lose muscle control.  You lose your balance or have trouble walking.  You feel like you will pass out (faint) or you pass out.  You have confusion.   This information is not intended to replace advice given to you by your health care provider. Make sure you discuss any questions you have with your health care provider.   Document Released: 06/26/2008 Document Revised: 06/08/2015 Document Reviewed: 01/10/2015 Elsevier Interactive Patient Education [employer].

## 2015-10-28 LAB — CSF CULTURE W GRAM STAIN: Special Requests: NORMAL

## 2015-10-28 LAB — CSF CULTURE: CULTURE: NO GROWTH

## 2016-06-16 ENCOUNTER — Observation Stay (HOSPITAL_COMMUNITY): Payer: Medicare Other

## 2016-06-16 ENCOUNTER — Encounter (HOSPITAL_COMMUNITY): Payer: Self-pay

## 2016-06-16 ENCOUNTER — Observation Stay (HOSPITAL_COMMUNITY)
Admission: EM | Admit: 2016-06-16 | Discharge: 2016-06-19 | Disposition: A | Discharge status: Home / Self Care | Payer: Medicare Other | Attending: Internal Medicine | Admitting: Internal Medicine

## 2016-06-16 ENCOUNTER — Emergency Department (HOSPITAL_COMMUNITY): Payer: Medicare Other

## 2016-06-16 DIAGNOSIS — Z8546 Personal history of malignant neoplasm of prostate: Secondary | ICD-10-CM | POA: Diagnosis not present

## 2016-06-16 DIAGNOSIS — Z992 Dependence on renal dialysis: Secondary | ICD-10-CM | POA: Insufficient documentation

## 2016-06-16 DIAGNOSIS — N186 End stage renal disease: Secondary | ICD-10-CM | POA: Diagnosis present

## 2016-06-16 DIAGNOSIS — E876 Hypokalemia: Secondary | ICD-10-CM

## 2016-06-16 DIAGNOSIS — Z87891 Personal history of nicotine dependence: Secondary | ICD-10-CM | POA: Insufficient documentation

## 2016-06-16 DIAGNOSIS — R1030 Lower abdominal pain, unspecified: Secondary | ICD-10-CM | POA: Diagnosis present

## 2016-06-16 DIAGNOSIS — R079 Chest pain, unspecified: Secondary | ICD-10-CM | POA: Diagnosis not present

## 2016-06-16 DIAGNOSIS — I10 Essential (primary) hypertension: Secondary | ICD-10-CM | POA: Diagnosis not present

## 2016-06-16 DIAGNOSIS — I120 Hypertensive chronic kidney disease with stage 5 chronic kidney disease or end stage renal disease: Secondary | ICD-10-CM | POA: Insufficient documentation

## 2016-06-16 DIAGNOSIS — R0789 Other chest pain: Secondary | ICD-10-CM

## 2016-06-16 DIAGNOSIS — R10814 Left lower quadrant abdominal tenderness: Secondary | ICD-10-CM

## 2016-06-16 LAB — CBC
HCT: 35.6 % — ABNORMAL LOW (ref 39.0–52.0)
HEMOGLOBIN: 11.6 g/dL — AB (ref 13.0–17.0)
MCH: 30.8 pg (ref 26.0–34.0)
MCHC: 32.6 g/dL (ref 30.0–36.0)
MCV: 94.4 fL (ref 78.0–100.0)
PLATELETS: 227 10*3/uL (ref 150–400)
RBC: 3.77 MIL/uL — AB (ref 4.22–5.81)
RDW: 13.3 % (ref 11.5–15.5)
WBC: 7.1 10*3/uL (ref 4.0–10.5)

## 2016-06-16 LAB — BASIC METABOLIC PANEL
ANION GAP: 9 (ref 5–15)
BUN: 16 mg/dL (ref 6–20)
CALCIUM: 9.8 mg/dL (ref 8.9–10.3)
CO2: 28 mmol/L (ref 22–32)
CREATININE: 2.49 mg/dL — AB (ref 0.61–1.24)
Chloride: 102 mmol/L (ref 101–111)
GFR, EST AFRICAN AMERICAN: 26 mL/min — AB (ref >60)
GFR, EST NON AFRICAN AMERICAN: 23 mL/min — AB (ref >60)
Glucose, Bld: 101 mg/dL — ABNORMAL HIGH (ref 65–99)
Potassium: 3.1 mmol/L — ABNORMAL LOW (ref 3.5–5.1)
SODIUM: 139 mmol/L (ref 135–145)

## 2016-06-16 LAB — I-STAT TROPONIN, ED: Troponin i, poc: 0.02 ng/mL (ref 0.00–0.08)

## 2016-06-16 LAB — TROPONIN I: Troponin I: 0.04 ng/mL (ref <0.03)

## 2016-06-16 MED ORDER — POTASSIUM CHLORIDE CRYS ER 20 MEQ PO TBCR
20.0000 meq | EXTENDED_RELEASE_TABLET | Freq: Once | ORAL | Status: AC
  Administered 2016-06-16: 20 meq via ORAL
  Filled 2016-06-16: qty 1

## 2016-06-16 MED ORDER — SENNA 8.6 MG PO TABS
1.0000 | ORAL_TABLET | Freq: Two times a day (BID) | ORAL | Status: DC
  Administered 2016-06-16 – 2016-06-18 (×4): 8.6 mg via ORAL
  Filled 2016-06-16 (×5): qty 1

## 2016-06-16 MED ORDER — ACETAMINOPHEN 650 MG RE SUPP: 650.0000 mg | Freq: Four times a day (QID) | RECTAL | Status: DC | PRN

## 2016-06-16 MED ORDER — ALBUTEROL SULFATE (2.5 MG/3ML) 0.083% IN NEBU: 2.5000 mg | INHALATION_SOLUTION | RESPIRATORY_TRACT | Status: DC | PRN

## 2016-06-16 MED ORDER — HYDROCODONE-ACETAMINOPHEN 5-325 MG PO TABS: 1.0000 | ORAL_TABLET | ORAL | Status: DC | PRN

## 2016-06-16 MED ORDER — SODIUM CHLORIDE 0.9% FLUSH: 3.0000 mL | INTRAVENOUS | Status: DC | PRN

## 2016-06-16 MED ORDER — ENOXAPARIN SODIUM 80 MG/0.8ML ~~LOC~~ SOLN
80.0000 mg | SUBCUTANEOUS | Status: DC
  Administered 2016-06-16: 80 mg via SUBCUTANEOUS
  Filled 2016-06-16 (×2): qty 0.8

## 2016-06-16 MED ORDER — SODIUM CHLORIDE 0.9% FLUSH
3.0000 mL | Freq: Two times a day (BID) | INTRAVENOUS | Status: DC
  Administered 2016-06-16 – 2016-06-18 (×3): 3 mL via INTRAVENOUS

## 2016-06-16 MED ORDER — CINACALCET HCL 30 MG PO TABS
90.0000 mg | ORAL_TABLET | Freq: Every day | ORAL | Status: DC
Start: 2016-06-17 — End: 2016-06-19
  Administered 2016-06-17: 90 mg via ORAL
  Filled 2016-06-16 (×3): qty 3

## 2016-06-16 MED ORDER — SODIUM CHLORIDE 0.9% FLUSH
3.0000 mL | Freq: Two times a day (BID) | INTRAVENOUS | Status: DC
  Administered 2016-06-17 – 2016-06-18 (×2): 3 mL via INTRAVENOUS

## 2016-06-16 MED ORDER — ACETAMINOPHEN 325 MG PO TABS: 650.0000 mg | ORAL_TABLET | Freq: Four times a day (QID) | ORAL | Status: DC | PRN

## 2016-06-16 MED ORDER — SODIUM CHLORIDE 0.9 % IV SOLN: 250.0000 mL | INTRAVENOUS | Status: DC | PRN

## 2016-06-16 MED ORDER — IOPAMIDOL (ISOVUE-300) INJECTION 61%
100.0000 mL | Freq: Once | INTRAVENOUS | Status: AC | PRN
  Administered 2016-06-16: 100 mL via INTRAVENOUS

## 2016-06-16 NOTE — ED Provider Notes (Signed)
[place] DEPT Provider Note   CSN: [id number] Arrival date & time: 06/16/16  1520     History   Chief Complaint Chief Complaint  Patient presents with  . Chest Pain    HPI [name] is a 82 y.o. male.  Patient is a 82 year old male with a history of a prior ruptured AAA status post endovascular repair in 2009, end-stage renal disease on dialysis, hypertension and hyperlipidemia who presents with chest pain. He was receiving his normal dose of dialysis today when he had onset of left-sided nonradiating chest pain. He had 3 episodes prior to arrival which each lasted about a minute or so. He states they were very intense. He had no associated shortness of breath. No nausea or vomiting. No diaphoresis. No prior history of known cardiac disease or similar chest pain. He had 2 other brief episodes while I was talking to him in the ED. It's not worse with movement. It's not worse with breathing.    Chest Pain   Pertinent negatives include no abdominal pain, no back pain, no cough, no diaphoresis, no dizziness, no fever, no headaches, no nausea, no numbness, no shortness of breath, no vomiting and no weakness.    Past Medical History:  Diagnosis Date  . AAA (abdominal aortic aneurysm) ([place]) 2009   Ruptured AAA  . Arthritis   . Cancer [facility])    Prostate cancer  . Chronic kidney disease   . Claudication ([place])   . Hemodialysis patient ([place])   . History of thromboembolism   . Hyperlipidemia   . Hypertension     Patient Active Problem List   Diagnosis Date Noted  . ESRD (end stage renal disease) ([place]) 06/16/2016  . Chest pain 06/16/2016  . Essential hypertension 06/16/2016  . Lower abdominal pain 06/16/2016  . Hypokalemia 06/16/2016  . Hemodialysis patient ([place])   . Abdominal aneurysm without mention of rupture 05/19/2012    Past Surgical History:  Procedure Laterality Date  . ABDOMINAL AORTIC ANEURYSM REPAIR  03/30/2008   Ruptured AAA  . AV FISTULA PLACEMENT   04/23/11   Left arm revision AVG   . THROMBECTOMY / ARTERIOVENOUS GRAFT REVISION     Numerous revision/ declots of Left Forearm AVG       Home Medications    Prior to Admission medications   Medication Sig Start Date End Date Taking? Authorizing Provider  cinacalcet (SENSIPAR) 90 MG tablet Take 90 mg by mouth daily.   Yes Historical Provider, MD  ibuprofen (ADVIL,MOTRIN) 200 MG tablet Take 200 mg by mouth every 6 (six) hours as needed for mild pain.   Yes Historical Provider, MD    Family History Family History  Problem Relation Age of Onset  . Heart attack Father     Social History Social History  Substance Use Topics  . Smoking status: Former Smoker    Quit date: 10/01/1976  . Smokeless tobacco: Never Used  . Alcohol use Yes     Allergies   Erythromycin   Review of Systems Review of Systems  Constitutional: Negative for chills, diaphoresis, fatigue and fever.  HENT: Negative for congestion, rhinorrhea and sneezing.   Eyes: Negative.   Respiratory: Negative for cough, chest tightness and shortness of breath.   Cardiovascular: Positive for chest pain. Negative for leg swelling.  Gastrointestinal: Negative for abdominal pain, blood in stool, diarrhea, nausea and vomiting.  Genitourinary: Negative for difficulty urinating, flank pain, frequency and hematuria.  Musculoskeletal: Negative for arthralgias and back pain.  Skin: Negative for  rash.  Neurological: Negative for dizziness, speech difficulty, weakness, numbness and headaches.     Physical Exam Updated Vital Signs BP 152/76   Pulse 70   Temp 98.3 F (36.8 C) (Oral)   Resp 18   Ht 5\' 10"  (1.778 m)   SpO2 100%   Physical Exam  Constitutional: He is oriented to person, place, and time. He appears well-developed and well-nourished.  HENT:  Head: Normocephalic and atraumatic.  Eyes: Pupils are equal, round, and reactive to light.  Neck: Normal range of motion. Neck supple.  Cardiovascular: Normal rate,  regular rhythm and normal heart sounds.   Pulmonary/Chest: Effort normal and breath sounds normal. No respiratory distress. He has no wheezes. He has no rales. He exhibits no tenderness.  Abdominal: Soft. Bowel sounds are normal. There is no tenderness. There is no rebound and no guarding.  Musculoskeletal: Normal range of motion. He exhibits no edema.  Lymphadenopathy:    He has no cervical adenopathy.  Neurological: He is alert and oriented to person, place, and time.  Skin: Skin is warm and dry. No rash noted.  Psychiatric: He has a normal mood and affect.     ED Treatments / Results  Labs (all labs ordered are listed, but only abnormal results are displayed) Labs Reviewed  BASIC METABOLIC PANEL - Abnormal; Notable for the following:       Result Value   Potassium 3.1 (*)    Glucose, Bld 101 (*)    Creatinine, Ser 2.49 (*)    GFR calc non Af Amer 23 (*)    GFR calc Af Amer 26 (*)    All other components within normal limits  CBC - Abnormal; Notable for the following:    RBC 3.77 (*)    Hemoglobin 11.6 (*)    HCT 35.6 (*)    All other components within normal limits  TROPONIN I  TROPONIN I  TROPONIN I  MAGNESIUM  PHOSPHORUS  TSH  COMPREHENSIVE METABOLIC PANEL  CBC  HEMOGLOBIN A1C  MAGNESIUM  I-STAT TROPOININ, ED    EKG  EKG Interpretation  Date/Time:  Saturday June 16 2016 17:07:15 EDT Ventricular Rate:  56 PR Interval:    QRS Duration: 149 QT Interval:  497 QTC Calculation: 480 R Axis:   -9 Text Interpretation:  Atrial fibrillation Right bundle branch block Confirmed by Tennille Montelongo  MD, Gilman Olazabal ([id number]) on 06/16/2016 5:10:45 PM       Radiology Dg Chest 2 View  Result Date: 06/16/2016 CLINICAL DATA:  Chest pain EXAM: CHEST  2 VIEW COMPARISON:  10/25/2015 chest radiograph. FINDINGS: Stable cardiomediastinal silhouette with normal heart size and aortic atherosclerosis. No pneumothorax. No pleural effusion. Hyperinflated lungs. Emphysema. Calcified pleural  plaques bilaterally appear unchanged. No acute consolidative airspace disease. No pulmonary edema. IMPRESSION: 1. No acute cardiopulmonary disease. 2. Hyperinflated lungs and emphysema, suggesting COPD. 3. Calcified pleural plaques bilaterally, probably asbestos related. 4. Aortic atherosclerosis. Electronically Signed   By: Ilona Sorrel M.D.   On: 06/16/2016 18:20    Procedures Procedures (including critical care time)  Medications Ordered in ED Medications  cinacalcet (SENSIPAR) tablet 90 mg (not administered)  sodium chloride flush (NS) 0.9 % injection 3 mL (not administered)  acetaminophen (TYLENOL) tablet 650 mg (not administered)    Or  acetaminophen (TYLENOL) suppository 650 mg (not administered)  HYDROcodone-acetaminophen (NORCO/VICODIN) 5-325 MG per tablet 1-2 tablet (not administered)  sodium chloride flush (NS) 0.9 % injection 3 mL (not administered)  sodium chloride flush (NS) 0.9 % injection 3  mL (not administered)  0.9 %  sodium chloride infusion (not administered)  senna (SENOKOT) tablet 8.6 mg (not administered)  albuterol (PROVENTIL) (2.5 MG/3ML) 0.083% nebulizer solution 2.5 mg (not administered)  potassium chloride SA (K-DUR,KLOR-CON) CR tablet 20 mEq (not administered)     Initial Impression / Assessment and Plan / ED Course  I have reviewed the triage vital signs and the nursing notes.  Pertinent labs & imaging results that were available during my care of the patient were reviewed by me and considered in my medical decision making (see chart for details).  Clinical Course    Pt with left side chest pain.  Still having intermittent episodes.  Will consult hospitalist for admission for further cardiac evaluation..  EKG ok.  Trop neg.  Final Clinical Impressions(s) / ED Diagnoses   Final diagnoses:  Chest pain, unspecified chest pain type    New Prescriptions New Prescriptions   No medications on file     Malvin Johns, MD 06/16/16 2054

## 2016-06-16 NOTE — Progress Notes (Signed)
ANTICOAGULATION CONSULT NOTE - Initial Consult  Pharmacy Consult for lovenox Indication: chest pain/ACS/afib  Allergies  Allergen Reactions  . Erythromycin Other (See Comments)    Pt states "ALL MYCINS" (caused extreme stomach pain)    Patient Measurements:   Heparin Dosing Weight:   Vital Signs: Temp: 98.3 F (36.8 C) (09/16 1537) Temp Source: Oral (09/16 1537) BP: 152/76 (09/16 2030) Pulse Rate: 70 (09/16 2030)  Labs:  Recent Labs  06/16/16 1544  HGB 11.6*  HCT 35.6*  PLT 227  CREATININE 2.49*    CrCl cannot be calculated (Unknown ideal weight.).   Medical History: Past Medical History:  Diagnosis Date  . AAA (abdominal aortic aneurysm) ([place]) 2009   Ruptured AAA  . Arthritis   . Cancer [facility])    Prostate cancer  . Chronic kidney disease   . Claudication ([place])   . Hemodialysis patient ([place])   . History of thromboembolism   . Hyperlipidemia   . Hypertension     Medications:  Scheduled:  . cinacalcet  90 mg Oral Daily  . potassium chloride  20 mEq Oral Once  . senna  1 tablet Oral BID  . sodium chloride flush  3 mL Intravenous Q12H  . sodium chloride flush  3 mL Intravenous Q12H   Infusions:  . sodium chloride      Assessment: 82 yo who presented with CP after being on HD for 3 hrs. His usual schedule is TTS at [facility]. He was also found to be in afib also. Lovenox has been ordered for anticoagulation.   Goal of Therapy:  Heparin level 0.6-1 units/ml Monitor platelets by anticoagulation protocol: Yes   Plan:   Lovenox 80mg  sq q24 CBC q3days  Onnie Boer, PharmD Pager: [phone] 06/16/2016 9:00 PM

## 2016-06-16 NOTE — ED Notes (Signed)
Delay in lab draw admittting MD examining pt.

## 2016-06-16 NOTE — ED Triage Notes (Signed)
To room via EMS from dialysis.  After dialysis started pt started getting intermittent chest pain, every 15-20 min, lasts few seconds and shortness of breath.  Shortness of breath has resolved.  Dialysis cath still assessed.  Pain scale of 8/10 when having chest pain.

## 2016-06-16 NOTE — H&P (Signed)
[name] [id number] DOB: 1933/03/21 DOA: 06/16/2016     PCP: Kandice Hams, MD   Outpatient Specialists: Vascular surgery Trula Slade Renal Lorrene Reid Patient coming from:    home Lives alone,        Chief Complaint: chest pain  HPI: [name] is a 82 y.o. male with medical history significant of AAA sp repair, HTN, ESRD on HD Tue, Thur, Saturday [facility]    Presented with chest pain left side pain/dyscomfort started after being on HD for 3 hours. At first it was severe reports pain was in the left lower abdomen. Family reports he has been vomiting some for the past 1 year.  Describes as intermittent, tingling, recurring every 15-20 min but only lasting few min at  A time. Was initially associated with some shortness of breath but now resolved. When the chest pain occurs it is 8/10 in severity. Describes sharp chest jabs but lasting just few seconds reproducible by palpation and non pleuritic.   currently chest pain free.    Regarding pertinent Chronic problems: Patient has history of prostate cancer and  AAA in 2009 with rapture requiring repair and resulting in renal failure. He has been on HD since then.   IN ER:  Temp (24hrs), Avg:98.3 F (36.8 C), Min:98.3 F (36.8 C), Max:98.3 F (36.8 C)  RR 21 HR 56 now up to 85 BP 142/46  Trop 0.02 K 3.1 Cr 2.49 WBC 7.1 Hg 11.6 Plt 227  CXR - possible COPD pleural plaques possibly related to asbestos   Following Medications were ordered in ER: Medications - No data to display    Hospitalist was called for admission for chest pain  Review of Systems:    Pertinent positives include: chest pain, shortness of breath at rest.  Constitutional:  No weight loss, night sweats, Fevers, chills, fatigue, weight loss  HEENT:  No headaches, Difficulty swallowing,Tooth/dental problems,Sore throat,  No sneezing, itching, ear ache, nasal congestion, post nasal drip,  Cardio-vascular:  No  Orthopnea, PND, anasarca,  dizziness, palpitations.no Bilateral lower extremity swelling  GI:  No heartburn, indigestion, abdominal pain, nausea, vomiting, diarrhea, change in bowel habits, loss of appetite, melena, blood in stool, hematemesis Resp:  no No dyspnea on exertion, No excess mucus, no productive cough, No non-productive cough, No coughing up of blood.No change in color of mucus.No wheezing. Skin:  no rash or lesions. No jaundice GU:  no dysuria, change in color of urine, no urgency or frequency. No straining to urinate.  No flank pain.  Musculoskeletal:  No joint pain or no joint swelling. No decreased range of motion. No back pain.  Psych:  No change in mood or affect. No depression or anxiety. No memory loss.  Neuro: no localizing neurological complaints, no tingling, no weakness, no double vision, no gait abnormality, no slurred speech, no confusion  As per HPI otherwise 10 point review of systems negative.   Past Medical History: Past Medical History:  Diagnosis Date  . AAA (abdominal aortic aneurysm) ([place]) 2009   Ruptured AAA  . Arthritis   . Cancer [facility])    Prostate cancer  . Chronic kidney disease   . Claudication ([place])   . Hemodialysis patient ([place])   . History of thromboembolism   . Hyperlipidemia   . Hypertension    Past Surgical History:  Procedure Laterality Date  . ABDOMINAL AORTIC ANEURYSM REPAIR  03/30/2008   Ruptured AAA  . AV FISTULA PLACEMENT  04/23/11   Left arm revision AVG   .  THROMBECTOMY / ARTERIOVENOUS GRAFT REVISION     Numerous revision/ declots of Left Forearm AVG     Social History:  Ambulatory  Independently     reports that he quit smoking about 39 years ago. He has never used smokeless tobacco. He reports that he drinks alcohol. He reports that he does not use drugs.  Allergies:   Allergies  Allergen Reactions  . Erythromycin Other (See Comments)    Pt states "ALL MYCINS" (caused extreme stomach pain)       Family History:   Family  History  Problem Relation Age of Onset  . Heart attack Father     Medications: Prior to Admission medications   Medication Sig Start Date End Date Taking? Authorizing Provider  cinacalcet (SENSIPAR) 90 MG tablet Take 90 mg by mouth daily.   Yes Historical Provider, MD  ibuprofen (ADVIL,MOTRIN) 200 MG tablet Take 200 mg by mouth every 6 (six) hours as needed for mild pain.   Yes Historical Provider, MD    Physical Exam: Patient Vitals for the past 24 hrs:  BP Temp Temp src Pulse Resp SpO2  06/16/16 1838 150/56 - - 85 21 97 %  06/16/16 1745 (!) 142/49 - - 74 26 99 %  06/16/16 1730 153/68 - - (!) 48 15 98 %  06/16/16 1715 142/58 - - 68 19 98 %  06/16/16 1645 156/56 - - (!) 58 15 100 %  06/16/16 1630 135/57 - - 68 20 99 %  06/16/16 1615 (!) 150/50 - - (!) 55 19 99 %  06/16/16 1600 (!) 139/54 - - (!) 58 14 99 %  06/16/16 1545 (!) 114/49 - - 74 20 99 %  06/16/16 1537 (!) 142/46 98.3 F (36.8 C) Oral 65 20 100 %  06/16/16 1531 - - - - - 100 %  06/16/16 1530 (!) 142/46 - - (!) 56 22 100 %    1. General:  in No Acute distress 2. Psychological: Alert and Oriented 3. Head/ENT:     Dry Mucous Membranes                          Head Non traumatic, neck supple                            Poor Dentition 4. SKIN:   decreased Skin turgor,  Skin clean Dry and intact no rash 5. Heart: Regular rate and rhythm no  Murmur, Rub or gallop 6. Lungs: Clear to auscultation bilaterally, no wheezes or crackles   7. Abdomen: Soft,  non-tender, Non distended 8. Lower extremities: no clubbing, cyanosis, or edema 9. Neurologically Grossly intact, moving all 4 extremities equally  10. MSK: Normal range of motion   body mass index is unknown because there is no height or weight on file.  Labs on Admission:   Labs on Admission: I have personally reviewed following labs and imaging studies  CBC:  Recent Labs Lab 06/16/16 1544  WBC 7.1  HGB 11.6*  HCT 35.6*  MCV 94.4  PLT [id number]   Basic Metabolic  Panel:  Recent Labs Lab 06/16/16 1544  NA 139  K 3.1*  CL 102  CO2 28  GLUCOSE 101*  BUN 16  CREATININE 2.49*  CALCIUM 9.8   GFR: CrCl cannot be calculated (Unknown ideal weight.). Liver Function Tests: No results for input(s): AST, ALT, ALKPHOS, BILITOT, PROT, ALBUMIN in the last 168 hours.  No results for input(s): LIPASE, AMYLASE in the last 168 hours. No results for input(s): AMMONIA in the last 168 hours. Coagulation Profile: No results for input(s): INR, PROTIME in the last 168 hours. Cardiac Enzymes: No results for input(s): CKTOTAL, CKMB, CKMBINDEX, TROPONINI in the last 168 hours. BNP (last 3 results) No results for input(s): PROBNP in the last 8760 hours. HbA1C: No results for input(s): HGBA1C in the last 72 hours. CBG: No results for input(s): GLUCAP in the last 168 hours. Lipid Profile: No results for input(s): CHOL, HDL, LDLCALC, TRIG, CHOLHDL, LDLDIRECT in the last 72 hours. Thyroid Function Tests: No results for input(s): TSH, T4TOTAL, FREET4, T3FREE, THYROIDAB in the last 72 hours. Anemia Panel: No results for input(s): VITAMINB12, FOLATE, FERRITIN, TIBC, IRON, RETICCTPCT in the last 72 hours.  Sepsis Labs: @LABRCNTIP (procalcitonin:4,lacticidven:4) )No results found for this or any previous visit (from the past 240 hour(s)).    UA not ordered  Lab Results  Component Value Date   HGBA1C  06/23/2008    5.0 (NOTE)   The ADA recommends the following therapeutic goal for glycemic   control related to Hgb A1C measurement:   Goal of Therapy:   < 7.0% Hgb A1C   Reference: American Diabetes Association: Clinical Practice   Recommendations 2008, Diabetes Care,  2008, 31:(Suppl 1).    CrCl cannot be calculated (Unknown ideal weight.).  BNP (last 3 results) No results for input(s): PROBNP in the last 8760 hours.   ECG REPORT  Independently reviewed Rate: 59  Rhythm: A.fib ST&T Change: No acute ischemic changes   QTC 532  There were no vitals filed for  this visit.   Cultures:    Component Value Date/Time   SDES CSF 10/25/2015 0605   SDES CSF 10/25/2015 0605   SPECREQUEST Normal 10/25/2015 0605   SPECREQUEST Normal 10/25/2015 0605   CULT NO GROWTH 3 DAYS 10/25/2015 0605   REPTSTATUS 10/28/2015 FINAL 10/25/2015 0605   REPTSTATUS 10/25/2015 FINAL 10/25/2015 0605     Radiological Exams on Admission: Dg Chest 2 View  Result Date: 06/16/2016 CLINICAL DATA:  Chest pain EXAM: CHEST  2 VIEW COMPARISON:  10/25/2015 chest radiograph. FINDINGS: Stable cardiomediastinal silhouette with normal heart size and aortic atherosclerosis. No pneumothorax. No pleural effusion. Hyperinflated lungs. Emphysema. Calcified pleural plaques bilaterally appear unchanged. No acute consolidative airspace disease. No pulmonary edema. IMPRESSION: 1. No acute cardiopulmonary disease. 2. Hyperinflated lungs and emphysema, suggesting COPD. 3. Calcified pleural plaques bilaterally, probably asbestos related. 4. Aortic atherosclerosis. Electronically Signed   By: Ilona Sorrel M.D.   On: 06/16/2016 18:20    Chart has been reviewed    Assessment/Plan   82 y.o. male with medical history significant of AAA sp repair, HTN, ESRD on HD Tue, Thur, Saturday admitted for atypical chest pain/lower abdominal pain was found to have incidental evidence of atrial fibrillation  Present on Admission:  . Chest pain appears to be atypical but given risk factor cycle cardiac    Enzymes Monitored on Telemetry Obtain Echo and Notify Cardiology at That Patient Has Been Admitted. Atrial fibrillation - possibly new onset will obtain echo check TSH CHADVASC score 4. For now would initiate Lovenox    If confirmed A.fib would benefit from long term anticoagulation.  Esential hypertension stable monitor, if persistently elevated would benefit from initiation of antihypertensive regimen . Lower abdominal pain - CT abd to evaluate, no leukocytosis or fever, has endorsed frequent vomiting appears  self induced . Hypokalemia - replace gently and follow . ESRD (end  stage renal disease) ([place]) - as per nephrology, notified that patient has been admitted  long QT will avoid QT prolonging medication monitoring telemetry  Other plan as per orders.  DVT prophylaxis:    Lovenox     Code Status:  DNR/DNI  as per patient    Family Communication:   Family  at  Bedside  plan of care was discussed with niece who has MPOA  Disposition Plan:     To home once workup is complete and patient is stable                              Consults called: email cardiology, Left msg with nephrology  Admission status:   obs   Level of care     tele       I have spent a total of 54 min on this admission  [name] 06/16/2016, 8:10 PM    [facility]  Pager [phone]   after 2 AM please page floor coverage PA If 7AM-7PM, please contact the day team taking care of the patient  [email or web address]  Password [email or web address]

## 2016-06-16 NOTE — ED Provider Notes (Signed)
Attempted to get report from ED 

## 2016-06-17 ENCOUNTER — Other Ambulatory Visit: Payer: Self-pay | Admitting: Cardiology

## 2016-06-17 DIAGNOSIS — G43A1 Cyclical vomiting, intractable: Secondary | ICD-10-CM

## 2016-06-17 DIAGNOSIS — R1030 Lower abdominal pain, unspecified: Secondary | ICD-10-CM

## 2016-06-17 DIAGNOSIS — R0789 Other chest pain: Secondary | ICD-10-CM | POA: Diagnosis not present

## 2016-06-17 DIAGNOSIS — I248 Other forms of acute ischemic heart disease: Secondary | ICD-10-CM

## 2016-06-17 DIAGNOSIS — R10814 Left lower quadrant abdominal tenderness: Secondary | ICD-10-CM

## 2016-06-17 DIAGNOSIS — N186 End stage renal disease: Secondary | ICD-10-CM | POA: Diagnosis not present

## 2016-06-17 DIAGNOSIS — I739 Peripheral vascular disease, unspecified: Secondary | ICD-10-CM

## 2016-06-17 DIAGNOSIS — I120 Hypertensive chronic kidney disease with stage 5 chronic kidney disease or end stage renal disease: Secondary | ICD-10-CM | POA: Diagnosis not present

## 2016-06-17 DIAGNOSIS — Z8546 Personal history of malignant neoplasm of prostate: Secondary | ICD-10-CM | POA: Diagnosis not present

## 2016-06-17 DIAGNOSIS — R079 Chest pain, unspecified: Secondary | ICD-10-CM | POA: Diagnosis not present

## 2016-06-17 DIAGNOSIS — I10 Essential (primary) hypertension: Secondary | ICD-10-CM

## 2016-06-17 DIAGNOSIS — I700 Atherosclerosis of aorta: Secondary | ICD-10-CM

## 2016-06-17 LAB — TROPONIN I
Troponin I: 0.04 ng/mL (ref <0.03)
Troponin I: 0.05 ng/mL (ref <0.03)

## 2016-06-17 LAB — CBC
HCT: 32.3 % — ABNORMAL LOW (ref 39.0–52.0)
HEMOGLOBIN: 10.9 g/dL — AB (ref 13.0–17.0)
MCH: 31.5 pg (ref 26.0–34.0)
MCHC: 33.7 g/dL (ref 30.0–36.0)
MCV: 93.4 fL (ref 78.0–100.0)
Platelets: 211 10*3/uL (ref 150–400)
RBC: 3.46 MIL/uL — AB (ref 4.22–5.81)
RDW: 13.3 % (ref 11.5–15.5)
WBC: 8.2 10*3/uL (ref 4.0–10.5)

## 2016-06-17 LAB — COMPREHENSIVE METABOLIC PANEL
ALK PHOS: 117 U/L (ref 38–126)
ALT: 11 U/L — AB (ref 17–63)
ANION GAP: 13 (ref 5–15)
AST: 17 U/L (ref 15–41)
Albumin: 3 g/dL — ABNORMAL LOW (ref 3.5–5.0)
BILIRUBIN TOTAL: 0.4 mg/dL (ref 0.3–1.2)
BUN: 20 mg/dL (ref 6–20)
CALCIUM: 10.6 mg/dL — AB (ref 8.9–10.3)
CO2: 27 mmol/L (ref 22–32)
CREATININE: 3.13 mg/dL — AB (ref 0.61–1.24)
Chloride: 99 mmol/L — ABNORMAL LOW (ref 101–111)
GFR, EST AFRICAN AMERICAN: 20 mL/min — AB (ref >60)
GFR, EST NON AFRICAN AMERICAN: 17 mL/min — AB (ref >60)
Glucose, Bld: 96 mg/dL (ref 65–99)
Potassium: 3.6 mmol/L (ref 3.5–5.1)
SODIUM: 139 mmol/L (ref 135–145)
TOTAL PROTEIN: 5.8 g/dL — AB (ref 6.5–8.1)

## 2016-06-17 LAB — MAGNESIUM
MAGNESIUM: 2.1 mg/dL (ref 1.7–2.4)
Magnesium: 2.1 mg/dL (ref 1.7–2.4)

## 2016-06-17 LAB — TSH: TSH: 7.605 u[IU]/mL — ABNORMAL HIGH (ref 0.350–4.500)

## 2016-06-17 LAB — HEMOGLOBIN A1C
HEMOGLOBIN A1C: 5.2 % (ref 4.8–5.6)
MEAN PLASMA GLUCOSE: 103 mg/dL

## 2016-06-17 LAB — PHOSPHORUS: PHOSPHORUS: 5.1 mg/dL — AB (ref 2.5–4.6)

## 2016-06-17 LAB — MRSA PCR SCREENING: MRSA BY PCR: NEGATIVE

## 2016-06-17 LAB — T4, FREE: FREE T4: 0.69 ng/dL (ref 0.61–1.12)

## 2016-06-17 MED ORDER — WARFARIN SODIUM 7.5 MG PO TABS: 7.5000 mg | ORAL_TABLET | Freq: Once | ORAL | Status: DC

## 2016-06-17 MED ORDER — WARFARIN SODIUM 2.5 MG PO TABS
2.5000 mg | ORAL_TABLET | Freq: Once | ORAL | Status: AC
  Administered 2016-06-17: 2.5 mg via ORAL
  Filled 2016-06-17: qty 1

## 2016-06-17 MED ORDER — LANTHANUM CARBONATE 500 MG PO CHEW
1000.0000 mg | CHEWABLE_TABLET | Freq: Three times a day (TID) | ORAL | Status: DC
  Administered 2016-06-17 – 2016-06-18 (×3): 1000 mg via ORAL
  Filled 2016-06-17 (×4): qty 2

## 2016-06-17 MED ORDER — RENA-VITE PO TABS
1.0000 | ORAL_TABLET | Freq: Every day | ORAL | Status: DC
  Administered 2016-06-17: 1 via ORAL
  Filled 2016-06-17 (×2): qty 1

## 2016-06-17 MED ORDER — NEPRO/CARBSTEADY PO LIQD
237.0000 mL | Freq: Two times a day (BID) | ORAL | Status: DC
  Administered 2016-06-17 – 2016-06-18 (×2): 237 mL via ORAL
  Filled 2016-06-17 (×7): qty 237

## 2016-06-17 MED ORDER — DOXERCALCIFEROL 4 MCG/2ML IV SOLN: 2.0000 ug | INTRAVENOUS | Status: DC

## 2016-06-17 MED ORDER — ENOXAPARIN SODIUM 80 MG/0.8ML ~~LOC~~ SOLN
70.0000 mg | SUBCUTANEOUS | Status: DC
  Administered 2016-06-17: 70 mg via SUBCUTANEOUS
  Filled 2016-06-17 (×2): qty 0.8

## 2016-06-17 MED ORDER — PANTOPRAZOLE SODIUM 40 MG PO TBEC
40.0000 mg | DELAYED_RELEASE_TABLET | Freq: Every day | ORAL | Status: DC
  Administered 2016-06-17 – 2016-06-18 (×2): 40 mg via ORAL
  Filled 2016-06-17 (×2): qty 1

## 2016-06-17 MED ORDER — WARFARIN - PHARMACIST DOSING INPATIENT: Freq: Every day | Status: DC

## 2016-06-17 NOTE — Progress Notes (Signed)
PROGRESS NOTE    [name]   [id number]  DOB: September 08, 1933  DOA: 06/16/2016 PCP: Kandice Hams, MD    Brief Narrative:  [name] is a 82 y.o. male with medical history significant of AAA sp repair, HTN, ESRD on HD presented with chest pain left side. Patient admitted to rule out ACS, troponins with mild elevation and EKG shows A. fib with right bundle branch block and no ST segment changes. Cardiology was consulted recommended nuclear stress test.   Subjective: Patient seen and examined at bedside with family member. Patient feeling well despite morning denies chest pain, shortness of breath, dizziness. Patient inquiring to go home since being symptomatic since last night. Cardiology at bedside recommended nuclear stress test. While discussing treatment and plan with patient he stood up vomited a few times. She reported feeling better after he vomits and continues to eat. Patient report drinking some alcohol occasionally.   Was discussed with his nephew the patient is to be in warfarin for unknown reasons. And at some point this was stopped because of lack of follow-up. Patient report he wants to go home  Assessment & Plan:   Active Problems:   ESRD (end stage renal disease) (HCC)   Chest pain   Essential hypertension   Lower abdominal pain   Hypokalemia    Chest pain - resolved - atypical chest pain, although patient with high risk factors, HEART score 4. Given his severe peripheral vascular disease as demonstrated as atherosclerosis on CT of abdomen, patient may have CAD.  Likely demand ischemia given its low level 0.04/0.05 with no peak or trend, also TNI could be related to ESRD.  -Cardiology recommendation seen and appreciated  -Schedule for nuclear stress test tomorrow  -If no further interventions needed after stress test can be d/c home with cardio follow up  -EKG in AM  -Pain control PRN   Atrial fibrillation - HR controlled  Seems that he has had  atrial fibrillation diagnosed previously but had been unreliable as far as taking his Coumadin and had difficulty maintaining a therapeutic INR according to his nephew. He has a bottle of Coumadin at home. - Will start warfarin given patient with ESRD  - Continue telemetry monitoring.  - No need for rhythm control medication at this point.  Esential hypertension stable - Monitor BP if elevated consider antihypertensive medications   Lower abdominal pain - Resolved, no clear etiology at this point. CT abdomen with no acute findings. Patient has had been vomiting for the past couple of months at least once a day. Patient report that after he vomits he feels better and continues with his normal daily routine. This could be related to acid reflux or gastroparesis. Will start PPI's, further evaluation as outpatient.   Hypokalemia - Resolved  ESRD (end stage renal disease) ([place]) - as per nephrology, notified that patient has been admitted  long QT will avoid QT prolonging medication monitoring telemetry   DVT prophylaxis:    Lovenox bridge with warfarin     Code Status:  DNR/DNI  as per patient    Family Communication:   Family  at  Bedside  plan of care was discussed with niece who has MPOA  Disposition Plan:     To home once workup is complete and patient is stable    Consultants:   Cardiology   Nephrology  Procedures:   ECHO pending    Objective: Vitals:   06/16/16 2144 06/17/16 0103 06/17/16 0509 06/17/16 0813  BP: Marland Kitchen)  158/88 (!) 137/58 (!) 145/41 (!) 151/50  Pulse: 76 76 63 80  Resp:      Temp: 98 F (36.7 C) 98 F (36.7 C) 98 F (36.7 C) 98.1 F (36.7 C)  TempSrc: Oral Oral Oral Oral  SpO2: 98% 95% 95% 97%  Weight: 71 kg (156 lb 8 oz)  70.4 kg (155 lb 1.6 oz)   Height: 5\' 10"  (1.778 m)       Intake/Output Summary (Last 24 hours) at 06/17/16 1343 Last data filed at 06/17/16 0913  Gross per 24 hour  Intake              246 ml  Output                0 ml  Net               246 ml   Filed Weights   06/16/16 2030 06/16/16 2144 06/17/16 0509  Weight: 84 kg (185 lb 3 oz) 71 kg (156 lb 8 oz) 70.4 kg (155 lb 1.6 oz)    Examination:  General exam: Appears calm and comfortable  Respiratory system: Clear to auscultation. Respiratory effort normal. Cardiovascular system: S1 & S2 heard, RRR. No JVD, murmurs, rubs, gallops or clicks. No pedal edema. Gastrointestinal system: Abdomen is nondistended, soft and nontender. No organomegaly or masses felt. Normal bowel sounds heard. Central nervous system: Alert and oriented. No focal neurological deficits. Extremities: Symmetric 5 x 5 power. Skin: No rashes, lesions or ulcers Psychiatry: Judgement and insight appear normal. Mood & affect appropriate.     Data Reviewed: I have personally reviewed following labs and imaging studies  CBC:  Recent Labs Lab 06/16/16 1544 06/17/16 0019  WBC 7.1 8.2  HGB 11.6* 10.9*  HCT 35.6* 32.3*  MCV 94.4 93.4  PLT 227 [id number]   Basic Metabolic Panel:  Recent Labs Lab 06/16/16 1544 06/17/16 0019 06/17/16 0628  NA 139 139  --   K 3.1* 3.6  --   CL 102 99*  --   CO2 28 27  --   GLUCOSE 101* 96  --   BUN 16 20  --   CREATININE 2.49* 3.13*  --   CALCIUM 9.8 10.6*  --   MG  --  2.1 2.1  PHOS  --   --  5.1*   GFR: Estimated Creatinine Clearance: 18.4 mL/min (by C-G formula based on SCr of 3.13 mg/dL (H)). Liver Function Tests:  Recent Labs Lab 06/17/16 0019  AST 17  ALT 11*  ALKPHOS 117  BILITOT 0.4  PROT 5.8*  ALBUMIN 3.0*   No results for input(s): LIPASE, AMYLASE in the last 168 hours. No results for input(s): AMMONIA in the last 168 hours. Coagulation Profile: No results for input(s): INR, PROTIME in the last 168 hours. Cardiac Enzymes:  Recent Labs Lab 06/16/16 2100 06/17/16 0019 06/17/16 0628  TROPONINI 0.04* 0.04* 0.05*   BNP (last 3 results) No results for input(s): PROBNP in the last 8760 hours. HbA1C:  Recent Labs  06/17/16 0019   HGBA1C 5.2   CBG: No results for input(s): GLUCAP in the last 168 hours. Lipid Profile: No results for input(s): CHOL, HDL, LDLCALC, TRIG, CHOLHDL, LDLDIRECT in the last 72 hours. Thyroid Function Tests:  Recent Labs  06/17/16 0019 06/17/16 0857  TSH 7.605*  --   FREET4  --  0.69   Anemia Panel: No results for input(s): VITAMINB12, FOLATE, FERRITIN, TIBC, IRON, RETICCTPCT in the last 72 hours. Sepsis Labs: No  results for input(s): PROCALCITON, LATICACIDVEN in the last 168 hours.  Recent Results (from the past 240 hour(s))  MRSA PCR Screening     Status: None   Collection Time: 06/17/16  7:26 AM  Result Value Ref Range Status   MRSA by PCR NEGATIVE NEGATIVE Final    Comment:        The GeneXpert MRSA Assay (FDA approved for NASAL specimens only), is one component of a comprehensive MRSA colonization surveillance program. It is not intended to diagnose MRSA infection nor to guide or monitor treatment for MRSA infections.          Radiology Studies: Dg Chest 2 View  Result Date: 06/16/2016 CLINICAL DATA:  Chest pain EXAM: CHEST  2 VIEW COMPARISON:  10/25/2015 chest radiograph. FINDINGS: Stable cardiomediastinal silhouette with normal heart size and aortic atherosclerosis. No pneumothorax. No pleural effusion. Hyperinflated lungs. Emphysema. Calcified pleural plaques bilaterally appear unchanged. No acute consolidative airspace disease. No pulmonary edema. IMPRESSION: 1. No acute cardiopulmonary disease. 2. Hyperinflated lungs and emphysema, suggesting COPD. 3. Calcified pleural plaques bilaterally, probably asbestos related. 4. Aortic atherosclerosis. Electronically Signed   By: Ilona Sorrel M.D.   On: 06/16/2016 18:20   Ct Abdomen Pelvis W Contrast  Result Date: 06/16/2016 CLINICAL DATA:  Onset of nonradiating LEFT side chest pain today during dialysis, total of 3 episodes, intense pain without associated symptoms, prior endovascular repair aorta, end-stage renal  disease on dialysis, hypertension, former smoker EXAM: CT ABDOMEN AND PELVIS WITH CONTRAST TECHNIQUE: Multidetector CT imaging of the abdomen and pelvis was performed using the standard protocol following bolus administration of intravenous contrast. Sagittal and coronal MPR images reconstructed from axial data set. CONTRAST:  100 cc Isovue 300 IV.  No oral contrast administered. COMPARISON:  04/24/2010 FINDINGS: Lower chest: Pleural calcifications and thickening at the lung bases bilaterally. Minimal pleural effusions bilaterally. Lungs clear. Hepatobiliary: Gallbladder normal appearance. Question minimally nodular hepatic contours. No focal hepatic mass lesion. Pancreas: Few scattered pancreatic parenchymal calcifications. No pancreatic mass Spleen: Normal appearance Adrenals/Urinary Tract: Marked BILATERAL renal cortical atrophy and cystic change compatible with end-stage renal disease and dialysis. No hydronephrosis. Bladder decompressed. Ureters unremarkable. Stomach/Bowel: Stomach decompressed, poorly assessed. Distal descending and sigmoid diverticulosis without evidence of diverticulitis. Large and small bowel loops otherwise normal. Normal appendix. Vascular/Lymphatic: Extensive atherosclerotic calcification. Prior aortic stenting into the common iliac arteries bilaterally. Native aneurysm sac measures 4.4 x 3.9 cm image 39 previously 4.5 x 4.7 cm at same level. No definite evidence of endoleak or surrounding hemorrhage. Extensive atherosclerotic calcifications at the origins of the renal arteries bilaterally, celiac artery and SMA. No adenopathy. Reproductive: N/A Other: No free air, free fluid, hernia or acute inflammatory process. Musculoskeletal: Osseous demineralization without acute bony abnormality. IMPRESSION: Extensive atherosclerotic disease post endoluminal stenting of abdominal aortic aneurysm. Interval decrease in size of native aneurysm sac since 2011. Significant atherosclerotic calcification  at the origins of the renal arteries, celiac artery and SMA. Distal colonic diverticulosis. Atrophic kidneys with extensive cystic change compatible with end-stage renal disease on dialysis. Question prior asbestos exposure with tiny pleural effusions. No acute intra-abdominal or intrapelvic abnormalities. Electronically Signed   By: Lavonia Dana M.D.   On: 06/16/2016 21:48        Scheduled Meds: . cinacalcet  90 mg Oral Q breakfast  . enoxaparin (LOVENOX) injection  70 mg Subcutaneous Q24H  . senna  1 tablet Oral BID  . sodium chloride flush  3 mL Intravenous Q12H  . sodium chloride flush  3  mL Intravenous Q12H  . warfarin  7.5 mg Oral ONCE-1800  . Warfarin - Pharmacist Dosing Inpatient   Does not apply q1800   Continuous Infusions:    LOS: 0 days    Time spent: 60 minutes    Doreatha Lew, MD [facility] Pager [phone]  If 7PM-7AM, please contact night-coverage [email or web address] Password [email or web address] 06/17/2016, 1:43 PM

## 2016-06-17 NOTE — Progress Notes (Addendum)
ANTICOAGULATION CONSULT NOTE - Initial Consult  Pharmacy Consult for lovenox and warfarin Indication: chest pain/ACS/afib  Allergies  Allergen Reactions  . Erythromycin Other (See Comments)    Pt states "ALL MYCINS" (caused extreme stomach pain)    Patient Measurements: Height: 5\' 10"  (177.8 cm) Weight: 155 lb 1.6 oz (70.4 kg) IBW/kg (Calculated) : 73 Heparin Dosing Weight:   Vital Signs: Temp: 98.1 F (36.7 C) (09/17 0813) Temp Source: Oral (09/17 0813) BP: 151/50 (09/17 0813) Pulse Rate: 80 (09/17 0813)  Labs:  Recent Labs  06/16/16 1544 06/16/16 2100 06/17/16 0019 06/17/16 0628  HGB 11.6*  --  10.9*  --   HCT 35.6*  --  32.3*  --   PLT 227  --  211  --   CREATININE 2.49*  --  3.13*  --   TROPONINI  --  0.04* 0.04* 0.05*    Estimated Creatinine Clearance: 18.4 mL/min (by C-G formula based on SCr of 3.13 mg/dL (H)).   Medical History: Past Medical History:  Diagnosis Date  . AAA (abdominal aortic aneurysm) ([place]) 2009   Ruptured AAA  . Arthritis   . Cancer [facility])    Prostate cancer  . Chronic kidney disease   . Claudication ([place])   . Hemodialysis patient ([place])   . History of thromboembolism   . Hyperlipidemia   . Hypertension     Medications:  Scheduled:  . cinacalcet  90 mg Oral Q breakfast  . enoxaparin (LOVENOX) injection  70 mg Subcutaneous Q24H  . senna  1 tablet Oral BID  . sodium chloride flush  3 mL Intravenous Q12H  . sodium chloride flush  3 mL Intravenous Q12H  . warfarin  7.5 mg Oral ONCE-1800  . Warfarin - Pharmacist Dosing Inpatient   Does not apply q1800   Infusions:     Assessment: 82 yo who presented with CP after being on HD for 3 hrs. His usual schedule is T[facility] at [facility]. He was also found to be in afib also. Lovenox has been ordered for anticoagulation with plans to bridge to warfarin  Goal of Therapy:  Heparin level 0.6-1 units/ml Monitor platelets by anticoagulation protocol: Yes   Plan:  -Reduce dose of lovenox  to 70mg  Qday based on weight of 70.4 kg and being HD patient -Warfarin 2.5mg  x1 tonight since patient is 82 YO, will start at low dose -CBC q3day while on lovenox -Daily INR -Monitor for S/Sx of bleeding  Myer Peer Grayland Ormond), PharmD  [id number] Pharmacy Resident Pager: [phone] 06/17/2016 1:14 PM

## 2016-06-17 NOTE — Consult Note (Addendum)
Admit date: 06/16/2016 Referring Physician  Dr. Elon Jester Primary Physician Kandice Hams, MD Primary Cardiologist  Dr. Daneen Schick Vascular surgeon Dr. Trula Slade Nephrologist Dr. Lorrene Reid Reason for Consultation  chest pain  HPI: 82 year old male with AAA repair, hypertension, end-stage renal disease (Tuesday Thursday Saturday) with hypertension who came in with left-sided chest discomfort which occurred after being on hemodialysis for approximately 2-3 hours. In review of admission history, at first there were reports of severe pain in his left lower abdomen. He had been vomiting over the past year periodically. He describes an intermittent tingling every 15-20 minutes usually lasting a few minutes at a time. The first associate with shortness of breath. When chest pain does occur it is quite intense, 8/10. Usually quite sharp in character, jab-like sometimes reproducible by palpation.  When he arrived in the emergency room he was chest pain-free.  Troponin was mildly elevated at 0.4 and 0.5, low level, flat. Free T4 was normal at 0.69 with mildly elevated TSH of 7.6. Hemoglobin 10.9.  EKG demonstrates atrial fibrillation with right bundle branch block, no significant ST segment changes. CT scan of abdomen showed significant diffuse atherosclerotic disease of aorta and other great vessels. Pleural plaquing noted on chest x-ray.  While discussing with he and his nephew in the room, he sat up in the bed and vomited a few times. He states that this is been going on for several months. After he vomits he feels better and continues eating. He does drink some alcohol.  We also discussed warfarin and he does have a bottle of warfarin that is 80 years old at his house. At some point this was stopped because of the lack of ability to regulate his INR and given his reliability of taking the medication as prescribed.  After entering the room, his first comment was and can I get out of here.   PMH:     Past Medical History:  Diagnosis Date  . AAA (abdominal aortic aneurysm) ([place]) 2009   Ruptured AAA  . Arthritis   . Cancer [facility])    Prostate cancer  . Chronic kidney disease   . Claudication ([place])   . Hemodialysis patient ([place])   . History of thromboembolism   . Hyperlipidemia   . Hypertension     PSH:   Past Surgical History:  Procedure Laterality Date  . ABDOMINAL AORTIC ANEURYSM REPAIR  03/30/2008   Ruptured AAA  . AV FISTULA PLACEMENT  04/23/11   Left arm revision AVG   . THROMBECTOMY / ARTERIOVENOUS GRAFT REVISION     Numerous revision/ declots of Left Forearm AVG   Allergies:  Erythromycin Prior to Admit Meds:   Prior to Admission medications   Medication Sig Start Date End Date Taking? Authorizing Provider  cinacalcet (SENSIPAR) 90 MG tablet Take 90 mg by mouth daily.   Yes Historical Provider, MD  ibuprofen (ADVIL,MOTRIN) 200 MG tablet Take 200 mg by mouth every 6 (six) hours as needed for mild pain.   Yes Historical Provider, MD   Scheduled Meds: . cinacalcet  90 mg Oral Q breakfast  . enoxaparin (LOVENOX) injection  80 mg Subcutaneous Q24H  . senna  1 tablet Oral BID  . sodium chloride flush  3 mL Intravenous Q12H  . sodium chloride flush  3 mL Intravenous Q12H   Continuous Infusions:  PRN Meds:.sodium chloride, acetaminophen **OR** acetaminophen, albuterol, HYDROcodone-acetaminophen, sodium chloride flush  Fam HX:    Family History  Problem Relation Age of Onset  . Heart  attack Father    Social HX:    Social History   Social History  . Marital status: Divorced    Spouse name: N/A  . Number of children: N/A  . Years of education: N/A   Occupational History  . Not on file.   Social History Main Topics  . Smoking status: Former Smoker    Quit date: 10/01/1976  . Smokeless tobacco: Never Used  . Alcohol use Yes  . Drug use: No  . Sexual activity: Not on file   Other Topics Concern  . Not on file   Social History Narrative  . No narrative on  file     ROS:  All 11 ROS were addressed and are negative except what is stated in the HPI   Physical Exam: Blood pressure (!) 151/50, pulse 80, temperature 98.1 F (36.7 C), temperature source Oral, resp. rate 18, height 5\' 10"  (1.778 m), weight 155 lb 1.6 oz (70.4 kg), SpO2 97 %.   General: Frail, elderly, in no acute distress Head: Eyes PERRLA, No xanthomas.   Normal cephalic and atramatic  Lungs:   Clear bilaterally to auscultation and percussion. Normal respiratory effort. No wheezes, no rales. Heart:  Irregularly irregular S1 S2 Pulses are 2+ & equal. No murmur, rubs, gallops.  No carotid bruit. No JVD.  No abdominal bruits.  Abdomen: Bowel sounds are positive, abdomen soft and non-tender without masses. No hepatosplenomegaly. Msk:  Back normal. Decreased tone for age. Extremities:  No clubbing, cyanosis or edema.  DP +1 Neuro: Alert and oriented X 3, non-focal, MAE x 4 GU: Deferred Rectal: Deferred Psych:  Good affect, responds appropriately      Labs: Lab Results  Component Value Date   WBC 8.2 06/17/2016   HGB 10.9 (L) 06/17/2016   HCT 32.3 (L) 06/17/2016   MCV 93.4 06/17/2016   PLT 211 06/17/2016     Recent Labs Lab 06/17/16 0019  NA 139  K 3.6  CL 99*  CO2 27  BUN 20  CREATININE 3.13*  CALCIUM 10.6*  PROT 5.8*  BILITOT 0.4  ALKPHOS 117  ALT 11*  AST 17  GLUCOSE 96    Recent Labs  06/16/16 2100 06/17/16 0019 06/17/16 0628  TROPONINI 0.04* 0.04* 0.05*   No results found for: CHOL, HDL, LDLCALC, TRIG No results found for: DDIMER   Radiology:  Dg Chest 2 View  Result Date: 06/16/2016 CLINICAL DATA:  Chest pain EXAM: CHEST  2 VIEW COMPARISON:  10/25/2015 chest radiograph. FINDINGS: Stable cardiomediastinal silhouette with normal heart size and aortic atherosclerosis. No pneumothorax. No pleural effusion. Hyperinflated lungs. Emphysema. Calcified pleural plaques bilaterally appear unchanged. No acute consolidative airspace disease. No pulmonary  edema. IMPRESSION: 1. No acute cardiopulmonary disease. 2. Hyperinflated lungs and emphysema, suggesting COPD. 3. Calcified pleural plaques bilaterally, probably asbestos related. 4. Aortic atherosclerosis. Electronically Signed   By: Ilona Sorrel M.D.   On: 06/16/2016 18:20   Ct Abdomen Pelvis W Contrast  Result Date: 06/16/2016 CLINICAL DATA:  Onset of nonradiating LEFT side chest pain today during dialysis, total of 3 episodes, intense pain without associated symptoms, prior endovascular repair aorta, end-stage renal disease on dialysis, hypertension, former smoker EXAM: CT ABDOMEN AND PELVIS WITH CONTRAST TECHNIQUE: Multidetector CT imaging of the abdomen and pelvis was performed using the standard protocol following bolus administration of intravenous contrast. Sagittal and coronal MPR images reconstructed from axial data set. CONTRAST:  100 cc Isovue 300 IV.  No oral contrast administered. COMPARISON:  04/24/2010 FINDINGS: Lower  chest: Pleural calcifications and thickening at the lung bases bilaterally. Minimal pleural effusions bilaterally. Lungs clear. Hepatobiliary: Gallbladder normal appearance. Question minimally nodular hepatic contours. No focal hepatic mass lesion. Pancreas: Few scattered pancreatic parenchymal calcifications. No pancreatic mass Spleen: Normal appearance Adrenals/Urinary Tract: Marked BILATERAL renal cortical atrophy and cystic change compatible with end-stage renal disease and dialysis. No hydronephrosis. Bladder decompressed. Ureters unremarkable. Stomach/Bowel: Stomach decompressed, poorly assessed. Distal descending and sigmoid diverticulosis without evidence of diverticulitis. Large and small bowel loops otherwise normal. Normal appendix. Vascular/Lymphatic: Extensive atherosclerotic calcification. Prior aortic stenting into the common iliac arteries bilaterally. Native aneurysm sac measures 4.4 x 3.9 cm image 39 previously 4.5 x 4.7 cm at same level. No definite evidence of  endoleak or surrounding hemorrhage. Extensive atherosclerotic calcifications at the origins of the renal arteries bilaterally, celiac artery and SMA. No adenopathy. Reproductive: N/A Other: No free air, free fluid, hernia or acute inflammatory process. Musculoskeletal: Osseous demineralization without acute bony abnormality. IMPRESSION: Extensive atherosclerotic disease post endoluminal stenting of abdominal aortic aneurysm. Interval decrease in size of native aneurysm sac since 2011. Significant atherosclerotic calcification at the origins of the renal arteries, celiac artery and SMA. Distal colonic diverticulosis. Atrophic kidneys with extensive cystic change compatible with end-stage renal disease on dialysis. Question prior asbestos exposure with tiny pleural effusions. No acute intra-abdominal or intrapelvic abnormalities. Electronically Signed   By: Lavonia Dana M.D.   On: 06/16/2016 21:48   Personally viewed.  EKG:  As described above, atrial fibrillation right bundle branch block, nonspecific ST-T wave changes, PVCs long QTc (545ms) Personally viewed.   ASSESSMENT/PLAN:    82 year old male with diffuse peripheral vascular disease, aortic atherosclerosis, end-stage renal disease status post abdominal aortic aneurysm repair on hemodialysis here with prior chest discomfort, left lower quadrant abdominal discomfort, mildly elevated troponin consistent with demand ischemia, newly discovered atrial fibrillation currently on Lovenox 80 mg daily.  Atypical chest pain  - Agree, sharp in character, does not appear to be cardiac in origin although it is quite possible that he has coronary artery disease given his severe peripheral vascular disease as demonstrated as atherosclerosis on CT of abdomen.  - It would not be unreasonable to proceed with nuclear stress test to exclude any high risk ischemia. He however would like to go home. It is not unreasonable to continue this workup as an outpatient. I do not  think that he had true ACS. When asked about his discomfort, he points to his left lower abdomen which has improved. Sharp pain. Very atypical. The pain sometimes radiated up to his left diaphragm region.  Elevated troponin  - Likely demand ischemia given its low level 0.04/0.05 with no peak or trend.  - It would not be unreasonable to check a nuclear stress test as an outpatient. We will go ahead and set up.  Atrial fibrillation  - My guess is that he has had atrial fibrillation diagnosed previously but had been unreliable as far as taking his Coumadin and had difficulty maintaining a therapeutic INR according to his nephew. He has a bottle of Coumadin at home.  - My recommendation is to utilize warfarin given his end-stage renal disease.  - We can help monitor him at our clinic if necessary. He states that he has not been seeing a primary care physician regularly. He truly needs to establish care with one. Dr. Delfina Redwood has been listed. We will set up follow-up in our clinic as well.  End-stage renal disease  - Hemodialysis.  Peripheral vascular disease/aortic  atherosclerosis  - Seen incidentally on CT scan of abdomen  Status post abdominal aortic aneurysm repair  - Dr. Trula Slade  Occasional persistent vomiting  - Per primary team.   Candee Furbish, MD  06/17/2016  10:54 AM  Addendum: His nephew convinced him to stay to get further evaluation here in the hospital. We will order nuclear stress test 4 AM. We will also order pharmacy consult for Coumadin. His nephew states that he was unreliable at going to get his INRs checked.  Candee Furbish, MD

## 2016-06-17 NOTE — Progress Notes (Signed)
 [facility] Renal Consultation Note    Indication for Consultation:  Management of ESRD/hemodialysis; anemia, hypertension/volume and secondary hyperparathyroidism PCP: Dr. Delfina Redwood Nephrologist: Dr. Lorrene Reid Vascular Surgery: Dr. Trula Slade   HPI: [name] is a 82 y.o. male with ESRD 2/2 atheroembolic renal failure in setting of endovascular AAA repair. He has been on hemodialysis since 06/24/2008. He has hemodialysis T,Th,S at [facility]. 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   Patient reported to staff at HD center yesterday that he'd been having intermittent chest pain since awakening. He reported no other symptoms and went to scheduled HD session. Approximately mid treatment, he started to C/O L sided chest pain and SOB. He was sent via EMS to [facility] for evaluation. He arrived to ED around 1530 without C/O chest pain on arrival. Upon arrival to ED, T-98.3 BP 142/46 HR 65 RR 20 O2 Sat 100% on RA. EKG showed Afib, controlled ventricular rate 59. RBBB, Qt interval .53. Labs drawn post HD Na 3.1 K+ 3.1 C02 28  Troponin 0.04. HGB 11.6 WBC 7.1 PLT 227. CXR unremarkable for acute cardiopulmonary disease-hyperinflated lungs/emphysemia suggesting COPD with calcified pleural plaques bilaterally possibly asbestos related, aortic atherosclerosis. CT of the ab/pelvis without acute findings. He was admitted Atypical chest pain by primary. We have been asked to manage hemodialysis.   Currently patient denies chest pain, is telling jokes, very pleasant. Says he vomited early in day, only complaints are that he wants a bath and something to eat. Patient says he still drives to HD center. No fever, chills, cough, URI symptoms reported, + intermittent chest pain + intermittent abdominal pain, + vomiting, denies bloody emesis,  tarry stools, dysuria, flank pain, back pain, headaches, dizziness, hearing or vision changes.   Past Medical History:  Diagnosis Date  . AAA (abdominal aortic aneurysm) ([place]) 2009   Ruptured AAA  . Arthritis   . Cancer [facility])    Prostate cancer  . Chronic kidney disease   . Claudication ([place])   . Hemodialysis patient ([place])   . History of thromboembolism   . Hyperlipidemia   . Hypertension    Past Surgical History:  Procedure Laterality Date  . ABDOMINAL AORTIC ANEURYSM REPAIR  03/30/2008   Ruptured AAA  . AV FISTULA PLACEMENT  04/23/11   Left arm revision AVG   . THROMBECTOMY / ARTERIOVENOUS GRAFT REVISION     Numerous revision/ declots of Left Forearm AVG   Family History  Problem Relation Age of Onset  . Heart attack Father    Social History:  reports that he quit smoking about 39 years ago. He has never used smokeless tobacco. He reports that he drinks alcohol. He reports that he does not use drugs. Allergies  Allergen Reactions  . Erythromycin Other (See Comments)    Pt states "ALL MYCINS" (caused extreme stomach pain)   Prior to Admission medications   Medication Sig Start Date End Date Taking? Authorizing Provider  cinacalcet (SENSIPAR) 90 MG tablet Take 90 mg by mouth daily.   Yes Historical Provider, MD  ibuprofen (ADVIL,MOTRIN) 200 MG tablet Take 200 mg by mouth every 6 (six) hours as needed for mild pain.   Yes Historical Provider, MD   Current Facility-Administered Medications  Medication Dose Route Frequency Provider Last Rate Last Dose  . 0.9 %  sodium chloride infusion  250 mL Intravenous PRN Toy Baker, MD      .  acetaminophen (TYLENOL) tablet 650 mg  650 mg Oral Q6H PRN Toy Baker, MD       Or  . acetaminophen (TYLENOL) suppository 650 mg  650 mg Rectal Q6H PRN Toy Baker, MD      . albuterol (PROVENTIL) (2.5 MG/3ML) 0.083% nebulizer solution 2.5 mg  2.5 mg Nebulization Q2H PRN Toy Baker, MD      . cinacalcet (SENSIPAR)  tablet 90 mg  90 mg Oral Q breakfast Toy Baker, MD   90 mg at 06/17/16 0912  . enoxaparin (LOVENOX) injection 70 mg  70 mg Subcutaneous Q24H Haze Boyden, [facility]      . HYDROcodone-acetaminophen (NORCO/VICODIN) 5-325 MG per tablet 1-2 tablet  1-2 tablet Oral Q4H PRN Toy Baker, MD      . senna (SENOKOT) tablet 8.6 mg  1 tablet Oral BID Toy Baker, MD   8.6 mg at 06/17/16 0912  . sodium chloride flush (NS) 0.9 % injection 3 mL  3 mL Intravenous Q12H Toy Baker, MD   3 mL at 06/16/16 2200  . sodium chloride flush (NS) 0.9 % injection 3 mL  3 mL Intravenous Q12H Toy Baker, MD   3 mL at 06/17/16 0913  . sodium chloride flush (NS) 0.9 % injection 3 mL  3 mL Intravenous PRN Toy Baker, MD      . warfarin (COUMADIN) tablet 7.5 mg  7.5 mg Oral ONCE-1800 Haze Boyden, RPH      . Warfarin - Pharmacist Dosing Inpatient   Does not apply Kellogg Kang, [facility]       Labs: Basic Metabolic Panel:  Recent Labs Lab 06/16/16 1544 06/17/16 0019 06/17/16 0628  NA 139 139  --   K 3.1* 3.6  --   CL 102 99*  --   CO2 28 27  --   GLUCOSE 101* 96  --   BUN 16 20  --   CREATININE 2.49* 3.13*  --   CALCIUM 9.8 10.6*  --   PHOS  --   --  5.1*   Liver Function Tests:  Recent Labs Lab 06/17/16 0019  AST 17  ALT 11*  ALKPHOS 117  BILITOT 0.4  PROT 5.8*  ALBUMIN 3.0*    CBC:  Recent Labs Lab 06/16/16 1544 06/17/16 0019  WBC 7.1 8.2  HGB 11.6* 10.9*  HCT 35.6* 32.3*  MCV 94.4 93.4  PLT 227 211   Cardiac Enzymes:  Recent Labs Lab 06/16/16 2100 06/17/16 0019 06/17/16 0628  TROPONINI 0.04* 0.04* 0.05*   Studies/Results: Dg Chest 2 View  Result Date: 06/16/2016 CLINICAL DATA:  Chest pain EXAM: CHEST  2 VIEW COMPARISON:  10/25/2015 chest radiograph. FINDINGS: Stable cardiomediastinal silhouette with normal heart size and aortic atherosclerosis. No pneumothorax. No pleural effusion. Hyperinflated lungs. Emphysema. Calcified pleural plaques  bilaterally appear unchanged. No acute consolidative airspace disease. No pulmonary edema. IMPRESSION: 1. No acute cardiopulmonary disease. 2. Hyperinflated lungs and emphysema, suggesting COPD. 3. Calcified pleural plaques bilaterally, probably asbestos related. 4. Aortic atherosclerosis. Electronically Signed   By: Ilona Sorrel M.D.   On: 06/16/2016 18:20   Ct Abdomen Pelvis W Contrast  Result Date: 06/16/2016 CLINICAL DATA:  Onset of nonradiating LEFT side chest pain today during dialysis, total of 3 episodes, intense pain without associated symptoms, prior endovascular repair aorta, end-stage renal disease on dialysis, hypertension, former smoker EXAM: CT ABDOMEN AND PELVIS WITH CONTRAST TECHNIQUE: Multidetector CT imaging of the abdomen and pelvis was performed using the standard protocol following bolus administration of  intravenous contrast. Sagittal and coronal MPR images reconstructed from axial data set. CONTRAST:  100 cc Isovue 300 IV.  No oral contrast administered. COMPARISON:  04/24/2010 FINDINGS: Lower chest: Pleural calcifications and thickening at the lung bases bilaterally. Minimal pleural effusions bilaterally. Lungs clear. Hepatobiliary: Gallbladder normal appearance. Question minimally nodular hepatic contours. No focal hepatic mass lesion. Pancreas: Few scattered pancreatic parenchymal calcifications. No pancreatic mass Spleen: Normal appearance Adrenals/Urinary Tract: Marked BILATERAL renal cortical atrophy and cystic change compatible with end-stage renal disease and dialysis. No hydronephrosis. Bladder decompressed. Ureters unremarkable. Stomach/Bowel: Stomach decompressed, poorly assessed. Distal descending and sigmoid diverticulosis without evidence of diverticulitis. Large and small bowel loops otherwise normal. Normal appendix. Vascular/Lymphatic: Extensive atherosclerotic calcification. Prior aortic stenting into the common iliac arteries bilaterally. Native aneurysm sac measures  4.4 x 3.9 cm image 39 previously 4.5 x 4.7 cm at same level. No definite evidence of endoleak or surrounding hemorrhage. Extensive atherosclerotic calcifications at the origins of the renal arteries bilaterally, celiac artery and SMA. No adenopathy. Reproductive: N/A Other: No free air, free fluid, hernia or acute inflammatory process. Musculoskeletal: Osseous demineralization without acute bony abnormality. IMPRESSION: Extensive atherosclerotic disease post endoluminal stenting of abdominal aortic aneurysm. Interval decrease in size of native aneurysm sac since 2011. Significant atherosclerotic calcification at the origins of the renal arteries, celiac artery and SMA. Distal colonic diverticulosis. Atrophic kidneys with extensive cystic change compatible with end-stage renal disease on dialysis. Question prior asbestos exposure with tiny pleural effusions. No acute intra-abdominal or intrapelvic abnormalities. Electronically Signed   By: Lavonia Dana M.D.   On: 06/16/2016 21:48    ROS: As per HPI otherwise negative.   Physical Exam: Vitals:   06/16/16 2144 06/17/16 0103 06/17/16 0509 06/17/16 0813  BP: (!) 158/88 (!) 137/58 (!) 145/41 (!) 151/50  Pulse: 76 76 63 80  Resp:      Temp: 98 F (36.7 C) 98 F (36.7 C) 98 F (36.7 C) 98.1 F (36.7 C)  TempSrc: Oral Oral Oral Oral  SpO2: 98% 95% 95% 97%  Weight: 71 kg (156 lb 8 oz)  70.4 kg (155 lb 1.6 oz)   Height: 5\' 10"  (1.778 m)        General: Well developed, well nourished, in no acute distress. Head: Normocephalic, atraumatic, sclera non-icteric, mucus membranes are moist Neck: Supple. JVD not elevated. Lungs: Clear bilaterally to auscultation without wheezes, rales, or rhonchi. Breathing is unlabored. Heart: regularly irregular No M/R/G  Abdomen: Soft, non-tender, non-distended with normoactive bowel sounds. No rebound/guarding. No obvious abdominal masses. M-S:  Strength and tone appear normal for age. Lower extremities: Dry scaly skin  without edema or ischemic changes, no open wounds  Neuro: Alert and oriented X 3. Moves all extremities spontaneously. Psych:  Responds to questions appropriately with a normal affect. Dialysis Access: LUA AVG + bruit visibly pulsatile, scabs present from previous sticks.  Dialysis Orders: [place] T,Th,S 4 hours 400/Auto 1.5 72 kg 3.0 K/2.25Ca Linear Na UF profile 4 Heparin 8000 units IV q tx Hectoral 3 mcg IV q tx (Last PTH 720 05/24/16. Supposed to be taking sensipar 90 mg PO daily however doubt compliance) Labs: HGB 10.9 (06/14/16) Fe 79 Tsat 43 (05/24/16) Ca 9.4 Phos 5.4 (06/14/16) Fosrenol 1000 mg 1 tab PO TID AC  Assessment/Plan: 1.  Atypical Chest pain: Per primary. No chest pain at present. Cardiology consulted. Nuc stress in AM. Troponin 0.04, 0.04, 0.05 flat trend.  2. Abdominal pain/vomiting: per primary. CT of abd/pelvis negative. Still vomiting-chronic issue.  3.  ESRD -  TTHS NW. Next HD Tuesday.  4.  Hypertension/volume  - No antihypertensive meds on OP med list. BP stable. OP EDW 74 usually gets -0.2/+0.5 of EDW. Assess tomorrow for UFG for Tuesday if still in hospital. 5.  Anemia  - HGB 10.9 6.  Metabolic bone disease -  Continue VDRA/binders/Sensipar. Ca 10.6 C Ca 11.4 Use 2.0 Ca bath, decrease hectoral to 2 mcg IV q tx. Phos 5.1.  7.  Nutrition - Albumin 3.0 Renal Diet/Fld restrictions. Renal vit/nepro.  8. H/O PAF: Coumadin stopped 11/2012 per [facility] D/T issues w/follow up. Has been restarted per cardiology, bridging with lovenox. Concerned about compliance/safety issues D/T pt living alone.    H. Owens Shark, NP-C 06/17/2016, 1:33 PM  [place]

## 2016-06-18 ENCOUNTER — Observation Stay (HOSPITAL_BASED_OUTPATIENT_CLINIC_OR_DEPARTMENT_OTHER): Payer: Medicare Other

## 2016-06-18 ENCOUNTER — Observation Stay (HOSPITAL_COMMUNITY): Payer: Medicare Other

## 2016-06-18 DIAGNOSIS — R0789 Other chest pain: Secondary | ICD-10-CM | POA: Diagnosis not present

## 2016-06-18 DIAGNOSIS — N186 End stage renal disease: Secondary | ICD-10-CM | POA: Diagnosis not present

## 2016-06-18 DIAGNOSIS — R079 Chest pain, unspecified: Secondary | ICD-10-CM

## 2016-06-18 LAB — NM MYOCAR MULTI W/SPECT W/WALL MOTION / EF
CHL CUP NUCLEAR SDS: 1
CHL CUP RESTING HR STRESS: 54 {beats}/min
CHL CUP STRESS STAGE 2 GRADE: 0 %
CHL CUP STRESS STAGE 2 HR: 54 {beats}/min
CHL CUP STRESS STAGE 2 SPEED: 0 mph
CHL CUP STRESS STAGE 3 DBP: 43 mmHg
CHL CUP STRESS STAGE 3 HR: 75 {beats}/min
CHL CUP STRESS STAGE 3 SBP: 116 mmHg
CHL CUP STRESS STAGE 4 DBP: 51 mmHg
CHL CUP STRESS STAGE 4 GRADE: 0 %
CHL CUP STRESS STAGE 4 SBP: 116 mmHg
CSEPEDS: 44 s
CSEPEW: 1 METS
CSEPHR: 56 %
CSEPPHR: 66 {beats}/min
CSEPPMHR: 47 %
Exercise duration (min): 4 min
LV sys vol: 79 mL
LVDIAVOL: 96 mL (ref 62–150)
MPHR: 139 {beats}/min
Peak BP: 116 mmHg
RATE: 0.41
SRS: 1
SSS: 2
Stage 1 Grade: 0 %
Stage 1 HR: 54 {beats}/min
Stage 1 Speed: 0 mph
Stage 3 Grade: 0 %
Stage 3 Speed: 0 mph
Stage 4 HR: 66 {beats}/min
Stage 4 Speed: 0 mph
TID: 1.02

## 2016-06-18 LAB — BASIC METABOLIC PANEL
ANION GAP: 13 (ref 5–15)
BUN: 35 mg/dL — ABNORMAL HIGH (ref 6–20)
CALCIUM: 9.4 mg/dL (ref 8.9–10.3)
CO2: 25 mmol/L (ref 22–32)
Chloride: 100 mmol/L — ABNORMAL LOW (ref 101–111)
Creatinine, Ser: 4.75 mg/dL — ABNORMAL HIGH (ref 0.61–1.24)
GFR, EST AFRICAN AMERICAN: 12 mL/min — AB (ref >60)
GFR, EST NON AFRICAN AMERICAN: 10 mL/min — AB (ref >60)
GLUCOSE: 82 mg/dL (ref 65–99)
POTASSIUM: 4.3 mmol/L (ref 3.5–5.1)
Sodium: 138 mmol/L (ref 135–145)

## 2016-06-18 LAB — ECHOCARDIOGRAM COMPLETE
HEIGHTINCHES: 70 in
WEIGHTICAEL: 2481.6 [oz_av]

## 2016-06-18 LAB — PROTIME-INR
INR: 1.12
PROTHROMBIN TIME: 14.4 s (ref 11.4–15.2)

## 2016-06-18 MED ORDER — DOXERCALCIFEROL 4 MCG/2ML IV SOLN
3.0000 ug | INTRAVENOUS | Status: DC
  Administered 2016-06-19: 3 ug via INTRAVENOUS
  Filled 2016-06-18: qty 2

## 2016-06-18 MED ORDER — SODIUM CHLORIDE 0.9 % IV SOLN: INTRAVENOUS | Status: DC

## 2016-06-18 MED ORDER — TECHNETIUM TC 99M TETROFOSMIN IV KIT
30.0000 | PACK | Freq: Once | INTRAVENOUS | Status: AC | PRN
  Administered 2016-06-18: 30 via INTRAVENOUS

## 2016-06-18 MED ORDER — WARFARIN SODIUM 2.5 MG PO TABS
2.5000 mg | ORAL_TABLET | Freq: Once | ORAL | Status: AC
  Administered 2016-06-18: 2.5 mg via ORAL
  Filled 2016-06-18: qty 1

## 2016-06-18 MED ORDER — REGADENOSON 0.4 MG/5ML IV SOLN
0.4000 mg | Freq: Once | INTRAVENOUS | Status: AC
  Administered 2016-06-18: 0.4 mg via INTRAVENOUS
  Filled 2016-06-18: qty 5

## 2016-06-18 MED ORDER — TECHNETIUM TC 99M TETROFOSMIN IV KIT
10.0000 | PACK | Freq: Once | INTRAVENOUS | Status: AC | PRN
  Administered 2016-06-18: 10 via INTRAVENOUS

## 2016-06-18 MED ORDER — REGADENOSON 0.4 MG/5ML IV SOLN
INTRAVENOUS | Status: AC
  Filled 2016-06-18: qty 5

## 2016-06-18 MED ORDER — DARBEPOETIN ALFA 25 MCG/0.42ML IJ SOSY
25.0000 ug | PREFILLED_SYRINGE | INTRAMUSCULAR | Status: DC
  Filled 2016-06-18: qty 0.42

## 2016-06-18 NOTE — Progress Notes (Signed)
Lexiscan MV performed, 1 day study, [facility] to read.  Rosaria Ferries, PA-C 06/18/2016 10:00 AM Beeper [phone]

## 2016-06-18 NOTE — Progress Notes (Signed)
Spoke to Dr. Candiss Norse he said not to bladder scan patient or start IVF like orginally ordered.

## 2016-06-18 NOTE — Progress Notes (Signed)
  Echocardiogram 2D Echocardiogram has been performed.  [name] 06/18/2016, 2:43 PM

## 2016-06-18 NOTE — Progress Notes (Addendum)
PROGRESS NOTE    [name]   [id number]  DOB: [birth year]  DOA: 06/16/2016 PCP: Kandice Hams, MD    Brief Narrative:  [name] is a 82 y.o. male with medical history significant of AAA sp repair, HTN, ESRD on HD presented with chest pain left side. Patient admitted to rule out ACS, troponins with mild elevation and EKG shows A. fib with right bundle branch block and no ST segment changes. Cardiology was consulted recommended nuclear stress test.   Subjective:  Patient in bed, denies any chest or abdominal pain, no nausea, no shortness of breath. Currently feels better and wants to eat food.  Assessment & Plan:   Chest pain - resolved - atypical chest pain, although patient with high risk factors, HEART score 4. Troponin trend and non-ACS pattern, currently chest pain-free, has underwent TTE & stress test on 06/18/2016 we await test results.  Chronic Atrial fibrillation - HR controlled [place] vasc 2 score of at least 3 - Seems that he has had atrial fibrillation diagnosed previously but had been unreliable as far as taking his Coumadin and had difficulty maintaining a therapeutic INR according to his nephew. He has a bottle of Coumadin at home. Will start warfarin given patient with ESRD, Continue telemetry monitoring.  No need for rhythm control medication at this point.  Essential hypertension stable, Monitor BP if elevated consider antihypertensive medications   Lower abdominal pain & Intermittent N&V - Resolved, no clear etiology at this point. CT abdomen with no acute findings. Patient was offered GI input, he refused, recommend PCP for outpt workup.   Hypokalemia - Resolved  ESRD (end stage renal disease) ([place]) - as per nephrology, notified that patient has been admitted, TTS schedule   Long QT will avoid QT prolonging medication monitoring telemetry   Status post abdominal aortic aneurysm repair - follow with  Dr. Trula Slade    DVT prophylaxis:    Lovenox  bridge with warfarin     Code Status:  DNR/DNI  as per patient    Family Communication:   Family  at  Bedside  plan of care was discussed with niece who has MPOA  Disposition Plan:     To home once workup is complete and patient is stable    Consultants:   Cardiology   Nephrology  Procedures:     ECHO -  Left ventricle: The cavity size was normal. There was moderate concentric hypertrophy. Systolic function was mildly reduced. The estimated ejection fraction was in the range of 45% to 50%. Wall motion was normal; there were no regional wall motion abnormalities. The study was not technically sufficient to allow evaluation of LV diastolic dysfunction due to atrial fibrillation. - Aortic valve: Trileaflet; moderately thickened, moderately calcified leaflets. There was moderate regurgitation. - Mitral valve: There was moderate regurgitation directed eccentrically and posteriorly. - Left atrium: The atrium was severely dilated. - Right ventricle: The cavity size was mildly dilated. Wall thickness was normal. - Right atrium: The atrium was mildly dilated. - Tricuspid valve: There was moderate regurgitation. - Pulmonic valve: There was moderate regurgitation. - Pulmonary arteries: PA peak pressure: 43 mm Hg (S).  Impressions: - The right ventricular systolic pressure was increased consistent with moderate pulmonary hypertension.    Lexiscan   Objective: Vitals:   06/18/16 0945 06/18/16 0952 06/18/16 0954 06/18/16 0955  BP: (!) 130/41 (!) 116/43 (!) 116/43 (!) 116/51  Pulse: (!) 46 (!) 44 69 70  Resp:      Temp:  TempSrc:      SpO2:      Weight:      Height:        Intake/Output Summary (Last 24 hours) at 06/18/16 1315 Last data filed at 06/18/16 1000  Gross per 24 hour  Intake              243 ml  Output                0 ml  Net              243 ml   Filed Weights   06/16/16 2030 06/16/16 2144 06/17/16 0509  Weight: 84 kg (185 lb 3 oz) 71 kg (156 lb 8 oz)  70.4 kg (155 lb 1.6 oz)    Examination:  General exam: Appears calm and comfortable  Respiratory system: Clear to auscultation. Respiratory effort normal. Cardiovascular system: S1 & S2 heard, RRR. No JVD, murmurs, rubs, gallops or clicks. No pedal edema. Gastrointestinal system: Abdomen is nondistended, soft and nontender. No organomegaly or masses felt. Normal bowel sounds heard. Central nervous system: Alert and oriented. No focal neurological deficits. Extremities: Symmetric 5 x 5 power. Skin: No rashes, lesions or ulcers Psychiatry: Judgement and insight appear normal. Mood & affect appropriate.     Data Reviewed: I have personally reviewed following labs and imaging studies  CBC:  Recent Labs Lab 06/16/16 1544 06/17/16 0019  WBC 7.1 8.2  HGB 11.6* 10.9*  HCT 35.6* 32.3*  MCV 94.4 93.4  PLT 227 [id number]   Basic Metabolic Panel:  Recent Labs Lab 06/16/16 1544 06/17/16 0019 06/17/16 0628 06/18/16 0423  NA 139 139  --  138  K 3.1* 3.6  --  4.3  CL 102 99*  --  100*  CO2 28 27  --  25  GLUCOSE 101* 96  --  82  BUN 16 20  --  35*  CREATININE 2.49* 3.13*  --  4.75*  CALCIUM 9.8 10.6*  --  9.4  MG  --  2.1 2.1  --   PHOS  --   --  5.1*  --    GFR: Estimated Creatinine Clearance: 12.1 mL/min (by C-G formula based on SCr of 4.75 mg/dL (H)). Liver Function Tests:  Recent Labs Lab 06/17/16 0019  AST 17  ALT 11*  ALKPHOS 117  BILITOT 0.4  PROT 5.8*  ALBUMIN 3.0*   No results for input(s): LIPASE, AMYLASE in the last 168 hours. No results for input(s): AMMONIA in the last 168 hours. Coagulation Profile:  Recent Labs Lab 06/18/16 0423  INR 1.12   Cardiac Enzymes:  Recent Labs Lab 06/16/16 2100 06/17/16 0019 06/17/16 0628  TROPONINI 0.04* 0.04* 0.05*   BNP (last 3 results) No results for input(s): PROBNP in the last 8760 hours. HbA1C:  Recent Labs  06/17/16 0019  HGBA1C 5.2   CBG: No results for input(s): GLUCAP in the last 168 hours. Lipid  Profile: No results for input(s): CHOL, HDL, LDLCALC, TRIG, CHOLHDL, LDLDIRECT in the last 72 hours. Thyroid Function Tests:  Recent Labs  06/17/16 0019 06/17/16 0857  TSH 7.605*  --   FREET4  --  0.69   Anemia Panel: No results for input(s): VITAMINB12, FOLATE, FERRITIN, TIBC, IRON, RETICCTPCT in the last 72 hours. Sepsis Labs: No results for input(s): PROCALCITON, LATICACIDVEN in the last 168 hours.  Recent Results (from the past 240 hour(s))  MRSA PCR Screening     Status: None   Collection Time: 06/17/16  7:26  AM  Result Value Ref Range Status   MRSA by PCR NEGATIVE NEGATIVE Final    Comment:        The GeneXpert MRSA Assay (FDA approved for NASAL specimens only), is one component of a comprehensive MRSA colonization surveillance program. It is not intended to diagnose MRSA infection nor to guide or monitor treatment for MRSA infections.       Radiology Studies: Dg Chest 2 View  Result Date: 06/16/2016 CLINICAL DATA:  Chest pain EXAM: CHEST  2 VIEW COMPARISON:  10/25/2015 chest radiograph. FINDINGS: Stable cardiomediastinal silhouette with normal heart size and aortic atherosclerosis. No pneumothorax. No pleural effusion. Hyperinflated lungs. Emphysema. Calcified pleural plaques bilaterally appear unchanged. No acute consolidative airspace disease. No pulmonary edema. IMPRESSION: 1. No acute cardiopulmonary disease. 2. Hyperinflated lungs and emphysema, suggesting COPD. 3. Calcified pleural plaques bilaterally, probably asbestos related. 4. Aortic atherosclerosis. Electronically Signed   By: Ilona Sorrel M.D.   On: 06/16/2016 18:20   Ct Abdomen Pelvis W Contrast  Result Date: 06/16/2016 CLINICAL DATA:  Onset of nonradiating LEFT side chest pain today during dialysis, total of 3 episodes, intense pain without associated symptoms, prior endovascular repair aorta, end-stage renal disease on dialysis, hypertension, former smoker EXAM: CT ABDOMEN AND PELVIS WITH CONTRAST  TECHNIQUE: Multidetector CT imaging of the abdomen and pelvis was performed using the standard protocol following bolus administration of intravenous contrast. Sagittal and coronal MPR images reconstructed from axial data set. CONTRAST:  100 cc Isovue 300 IV.  No oral contrast administered. COMPARISON:  04/24/2010 FINDINGS: Lower chest: Pleural calcifications and thickening at the lung bases bilaterally. Minimal pleural effusions bilaterally. Lungs clear. Hepatobiliary: Gallbladder normal appearance. Question minimally nodular hepatic contours. No focal hepatic mass lesion. Pancreas: Few scattered pancreatic parenchymal calcifications. No pancreatic mass Spleen: Normal appearance Adrenals/Urinary Tract: Marked BILATERAL renal cortical atrophy and cystic change compatible with end-stage renal disease and dialysis. No hydronephrosis. Bladder decompressed. Ureters unremarkable. Stomach/Bowel: Stomach decompressed, poorly assessed. Distal descending and sigmoid diverticulosis without evidence of diverticulitis. Large and small bowel loops otherwise normal. Normal appendix. Vascular/Lymphatic: Extensive atherosclerotic calcification. Prior aortic stenting into the common iliac arteries bilaterally. Native aneurysm sac measures 4.4 x 3.9 cm image 39 previously 4.5 x 4.7 cm at same level. No definite evidence of endoleak or surrounding hemorrhage. Extensive atherosclerotic calcifications at the origins of the renal arteries bilaterally, celiac artery and SMA. No adenopathy. Reproductive: N/A Other: No free air, free fluid, hernia or acute inflammatory process. Musculoskeletal: Osseous demineralization without acute bony abnormality. IMPRESSION: Extensive atherosclerotic disease post endoluminal stenting of abdominal aortic aneurysm. Interval decrease in size of native aneurysm sac since 2011. Significant atherosclerotic calcification at the origins of the renal arteries, celiac artery and SMA. Distal colonic  diverticulosis. Atrophic kidneys with extensive cystic change compatible with end-stage renal disease on dialysis. Question prior asbestos exposure with tiny pleural effusions. No acute intra-abdominal or intrapelvic abnormalities. Electronically Signed   By: Lavonia Dana M.D.   On: 06/16/2016 21:48    Scheduled Meds: . cinacalcet  90 mg Oral Q breakfast  . [START ON 06/19/2016] doxercalciferol  2 mcg Intravenous Q T,Th,Sa-HD  . enoxaparin (LOVENOX) injection  70 mg Subcutaneous Q24H  . feeding supplement (NEPRO CARB STEADY)  237 mL Oral BID BM  . lanthanum  1,000 mg Oral TID WC  . multivitamin  1 tablet Oral QHS  . pantoprazole  40 mg Oral Daily  . regadenoson      . senna  1 tablet Oral BID  .  sodium chloride flush  3 mL Intravenous Q12H  . sodium chloride flush  3 mL Intravenous Q12H  . warfarin  2.5 mg Oral ONCE-1800  . Warfarin - Pharmacist Dosing Inpatient   Does not apply q1800   Continuous Infusions: . sodium chloride       LOS: 0 days    Time spent: 30 minutes  Thurnell Lose, MD [facility] Pager [phone]  If 7PM-7AM, please contact night-coverage [email or web address] Password [email or web address] 06/18/2016, 1:15 PM

## 2016-06-18 NOTE — Progress Notes (Signed)
Patient Name: [name] Date of Encounter: 06/18/2016  Active Problems:   ESRD (end stage renal disease) ([place])   Chest pain   Essential hypertension   Lower abdominal pain   Hypokalemia   Left lower quadrant abdominal tenderness   Primary Cardiologist: Dr Tamala Julian Patient Profile: 82 yo male w/ hx AAA repair w/ stent graft, HTN, ESRD on HD, ?hx PAF admitted 09/16 w/ chest pain, Afib seen on ECG, minimal trop elevation, MV planned.   SUBJECTIVE: No chest pain, no SOB, missed HD Saturday.  OBJECTIVE Vitals:   06/17/16 0813 06/17/16 1702 06/17/16 1939 06/18/16 0500  BP: (!) 151/50  (!) 127/42 (!) 142/37  Pulse: 80 82 (!) 59 (!) 50  Resp:  18    Temp: 98.1 F (36.7 C) 98.2 F (36.8 C) 98.3 F (36.8 C) 98.2 F (36.8 C)  TempSrc: Oral Oral Oral Oral  SpO2: 97% 98% 98% 98%  Weight:      Height:        Intake/Output Summary (Last 24 hours) at 06/18/16 0947 Last data filed at 06/18/16 0700  Gross per 24 hour  Intake              240 ml  Output                0 ml  Net              240 ml   Filed Weights   06/16/16 2030 06/16/16 2144 06/17/16 0509  Weight: 185 lb 3 oz (84 kg) 156 lb 8 oz (71 kg) 155 lb 1.6 oz (70.4 kg)    PHYSICAL EXAM General: elderly, frail no acute distress. Head: Normocephalic, atraumatic.  Neck: Supple without bruits, JVD elevated. Lungs:  Resp regular and unlabored, rales bases. Heart: RRR, S1, S2, no S3, S4, 2/6 murmur; no rub. Abdomen: Soft, non-tender, non-distended, BS + x 4.  Extremities: No clubbing, cyanosis, edema.  Neuro: Alert and oriented X 3. Moves all extremities spontaneously. Psych: Normal affect.  LABS: CBC: Recent Labs  06/16/16 1544 06/17/16 0019  WBC 7.1 8.2  HGB 11.6* 10.9*  HCT 35.6* 32.3*  MCV 94.4 93.4  PLT 227 211   INR: Recent Labs  06/18/16 0423  INR [id number]   Basic Metabolic Panel: Recent Labs  06/17/16 0019 06/17/16 0628 06/18/16 0423  NA 139  --  138  K 3.6  --  4.3  CL 99*  --  100*    CO2 27  --  25  GLUCOSE 96  --  82  BUN 20  --  35*  CREATININE 3.13*  --  4.75*  CALCIUM 10.6*  --  9.4  MG 2.1 2.1  --   PHOS  --  5.1*  --    Liver Function Tests: Recent Labs  06/17/16 0019  AST 17  ALT 11*  ALKPHOS 117  BILITOT 0.4  PROT 5.8*  ALBUMIN 3.0*   Cardiac Enzymes: Recent Labs  06/16/16 2100 06/17/16 0019 06/17/16 0628  TROPONINI 0.04* 0.04* 0.05*    Recent Labs  06/16/16 1556  TROPIPOC 0.02   Hemoglobin A1C: Recent Labs  06/17/16 0019  HGBA1C 5.2   Thyroid Function Tests: Recent Labs  06/17/16 0019  TSH 7.605*    TELE:  Atrial fib, slow VR     Radiology/Studies: Dg Chest 2 View Result Date: 06/16/2016 CLINICAL DATA:  Chest pain EXAM: CHEST  2 VIEW COMPARISON:  10/25/2015 chest radiograph. FINDINGS: Stable cardiomediastinal silhouette with normal  heart size and aortic atherosclerosis. No pneumothorax. No pleural effusion. Hyperinflated lungs. Emphysema. Calcified pleural plaques bilaterally appear unchanged. No acute consolidative airspace disease. No pulmonary edema. IMPRESSION: 1. No acute cardiopulmonary disease. 2. Hyperinflated lungs and emphysema, suggesting COPD. 3. Calcified pleural plaques bilaterally, probably asbestos related. 4. Aortic atherosclerosis. Electronically Signed   By: Ilona Sorrel M.D.   On: 06/16/2016 18:20   Ct Abdomen Pelvis W Contrast Result Date: 06/16/2016 CLINICAL DATA:  Onset of nonradiating LEFT side chest pain today during dialysis, total of 3 episodes, intense pain without associated symptoms, prior endovascular repair aorta, end-stage renal disease on dialysis, hypertension, former smoker EXAM: CT ABDOMEN AND PELVIS WITH CONTRAST TECHNIQUE: Multidetector CT imaging of the abdomen and pelvis was performed using the standard protocol following bolus administration of intravenous contrast. Sagittal and coronal MPR images reconstructed from axial data set. CONTRAST:  100 cc Isovue 300 IV.  No oral contrast  administered. COMPARISON:  04/24/2010 FINDINGS: Lower chest: Pleural calcifications and thickening at the lung bases bilaterally. Minimal pleural effusions bilaterally. Lungs clear. Hepatobiliary: Gallbladder normal appearance. Question minimally nodular hepatic contours. No focal hepatic mass lesion. Pancreas: Few scattered pancreatic parenchymal calcifications. No pancreatic mass Spleen: Normal appearance Adrenals/Urinary Tract: Marked BILATERAL renal cortical atrophy and cystic change compatible with end-stage renal disease and dialysis. No hydronephrosis. Bladder decompressed. Ureters unremarkable. Stomach/Bowel: Stomach decompressed, poorly assessed. Distal descending and sigmoid diverticulosis without evidence of diverticulitis. Large and small bowel loops otherwise normal. Normal appendix. Vascular/Lymphatic: Extensive atherosclerotic calcification. Prior aortic stenting into the common iliac arteries bilaterally. Native aneurysm sac measures 4.4 x 3.9 cm image 39 previously 4.5 x 4.7 cm at same level. No definite evidence of endoleak or surrounding hemorrhage. Extensive atherosclerotic calcifications at the origins of the renal arteries bilaterally, celiac artery and SMA. No adenopathy. Reproductive: N/A Other: No free air, free fluid, hernia or acute inflammatory process. Musculoskeletal: Osseous demineralization without acute bony abnormality. IMPRESSION: Extensive atherosclerotic disease post endoluminal stenting of abdominal aortic aneurysm. Interval decrease in size of native aneurysm sac since 2011. Significant atherosclerotic calcification at the origins of the renal arteries, celiac artery and SMA. Distal colonic diverticulosis. Atrophic kidneys with extensive cystic change compatible with end-stage renal disease on dialysis. Question prior asbestos exposure with tiny pleural effusions. No acute intra-abdominal or intrapelvic abnormalities. Electronically Signed   By: Lavonia Dana M.D.   On:  06/16/2016 21:48     Current Medications:  . cinacalcet  90 mg Oral Q breakfast  . [START ON 06/19/2016] doxercalciferol  2 mcg Intravenous Q T,Th,Sa-HD  . enoxaparin (LOVENOX) injection  70 mg Subcutaneous Q24H  . feeding supplement (NEPRO CARB STEADY)  237 mL Oral BID BM  . lanthanum  1,000 mg Oral TID WC  . multivitamin  1 tablet Oral QHS  . pantoprazole  40 mg Oral Daily  . regadenoson      . regadenoson  0.4 mg Intravenous Once  . senna  1 tablet Oral BID  . sodium chloride flush  3 mL Intravenous Q12H  . sodium chloride flush  3 mL Intravenous Q12H  . Warfarin - Pharmacist Dosing Inpatient   Does not apply q1800      ASSESSMENT AND PLAN: Active Problems:   ESRD (end stage renal disease) ([place]) - per Nephrology/IM    Chest pain - minimal elevation in troponin unclear significance in ESRD on HD pt.  - ok for MV today, f/u on results  Otherwise, per IM   Essential hypertension   Lower abdominal pain  Hypokalemia   Left lower quadrant abdominal tenderness   Signed, Lenoard Aden 9:47 AM 06/18/2016  Personally seen and examined. Agree with above.  82 year old male with diffuse peripheral vascular disease, aortic atherosclerosis, end-stage renal disease status post abdominal aortic aneurysm repair on hemodialysis here with prior chest discomfort, left lower quadrant abdominal discomfort, mildly elevated troponin consistent with demand ischemia, newly discovered atrial fibrillation currently on Lovenox 80 mg daily.  Atypical chest pain  - sharp in character, does not appear to be cardiac in origin although it is quite possible that he has coronary artery disease given his severe peripheral vascular disease as demonstrated as atherosclerosis on CT of abdomen.  -nuclear stress test to exclude any high risk ischemia - pending read.   - I do not think that he had true ACS. When asked about his discomfort, he points to his left lower abdomen which has improved.  Sharp pain. Very atypical. The pain sometimes radiated up to his left diaphragm region.  - await ECHO  - If high risk, consider cath.   Elevated troponin  - Likely demand ischemia given its low level 0.04/0.05 with no peak or trend.  - It would not be unreasonable to check a nuclear stress test as an outpatient. We will go ahead and set up.  Atrial fibrillation  - My guess is that he has had atrial fibrillation diagnosed previously but had been unreliable as far as taking his Coumadin and had difficulty maintaining a therapeutic INR according to his nephew. He has a bottle of Coumadin at home.  - My recommendation is to utilize warfarin given his end-stage renal disease.  - We can help monitor him at our clinic if necessary. He states that he has not been seeing a primary care physician regularly. He truly needs to establish care with one. Dr. Delfina Redwood has been listed. We will set up follow-up in our clinic as well.  End-stage renal disease  - Hemodialysis.  Peripheral vascular disease/aortic atherosclerosis  - Seen incidentally on CT scan of abdomen  Status post abdominal aortic aneurysm repair  - Dr. Trula Slade  Occasional persistent vomiting  - Per primary team.   Candee Furbish, MD

## 2016-06-18 NOTE — Progress Notes (Signed)
[name] for lovenox and warfarin Indication: chest pain/ACS/afib  Allergies  Allergen Reactions  . Erythromycin Other (See Comments)    Pt states "ALL MYCINS" (caused extreme stomach pain)    Patient Measurements: Height: 5\' 10"  (177.8 cm) Weight:  (refused weight) IBW/kg (Calculated) : 73 Heparin Dosing Weight:   Vital Signs: Temp: 98.2 F (36.8 C) (09/18 0500) Temp Source: Oral (09/18 0500) BP: 116/51 (09/18 0955) Pulse Rate: 70 (09/18 0955)  Labs:  Recent Labs  06/16/16 1544 06/16/16 2100 06/17/16 0019 06/17/16 0628 06/18/16 0423  HGB 11.6*  --  10.9*  --   --   HCT 35.6*  --  32.3*  --   --   PLT 227  --  211  --   --   LABPROT  --   --   --   --  14.4  INR  --   --   --   --  1.12  CREATININE 2.49*  --  3.13*  --  4.75*  TROPONINI  --  0.04* 0.04* 0.05*  --     Estimated Creatinine Clearance: 12.1 mL/min (by C-G formula based on SCr of 4.75 mg/dL (H)).   Medical History: Past Medical History:  Diagnosis Date  . AAA (abdominal aortic aneurysm) ([place]) 2009   Ruptured AAA  . Arthritis   . Cancer [facility])    Prostate cancer  . Chronic kidney disease   . Claudication ([place])   . Hemodialysis patient ([place])   . History of thromboembolism   . Hyperlipidemia   . Hypertension     Medications:  Scheduled:  . cinacalcet  90 mg Oral Q breakfast  . [START ON 06/19/2016] doxercalciferol  2 mcg Intravenous Q T,Th,Sa-HD  . enoxaparin (LOVENOX) injection  70 mg Subcutaneous Q24H  . feeding supplement (NEPRO CARB STEADY)  237 mL Oral BID BM  . lanthanum  1,000 mg Oral TID WC  . multivitamin  1 tablet Oral QHS  . pantoprazole  40 mg Oral Daily  . regadenoson      . senna  1 tablet Oral BID  . sodium chloride flush  3 mL Intravenous Q12H  . sodium chloride flush  3 mL Intravenous Q12H  . Warfarin - Pharmacist Dosing Inpatient   Does not apply q1800   Infusions:     Assessment: 82 yo who presented with CP after being on HD  for 3 hrs. He was found to be in afib.  He is on lovenox and coumadin for anticoagulation -INR= 1.12 on day 2 of coumadin  Goal of Therapy:  Heparin level 0.6-1 units/ml Monitor platelets by anticoagulation protocol: Yes   Plan:  -No lovenox changes needed -Warfarin 2.5mg  x1 tonight -CBC q3day while on lovenox -Daily INR  [name], Pharm D 06/18/2016 11:22 AM

## 2016-06-18 NOTE — Progress Notes (Signed)
Pt MV results below. Pt currently stable, f/u in am.  Myoview  There was no ST segment deviation noted during stress.  Defect 1: There is a small defect of mild severity present in the basal inferior and mid inferior location.  Findings consistent with ischemia.  This is an intermediate risk study.  The left ventricular ejection fraction is mildly decreased (45% based on the today's echo study).   There is small area mild severity reversible defect in the basal and mid inferior wall (SDS =4).   [name] 06/18/2016 8:51 PM Beeper [phone]

## 2016-06-18 NOTE — Care Management Obs Status (Signed)
[facility] NOTIFICATION   Patient Details  Name: [name] MRN: [id number] Date of Birth: 05/01/1933   Medicare Observation Status Notification Given:  Yes    Bethena Roys, RN 06/18/2016, 11:50 AM

## 2016-06-18 NOTE — Care Management Note (Signed)
Case Management Note  Patient Details  Name: [name] MRN: [id number] Date of Birth: October 28, 1932  Subjective/Objective:    Pt presented for chest pain. Hx ESRD and hypertension. Pt is from home alone. Per pt he still drives and is able to get medications. Pt did state that he eats out each meal.  CM did try to provide education on nutrition.              Action/Plan: CM will continue to monitor for additional needs.   Expected Discharge Date:                  Expected Discharge Plan:  Home/Self Care  In-House Referral:  NA  Discharge planning Services  CM Consult  Post Acute Care Choice:    Choice offered to:     DME Arranged:    DME Agency:     HH Arranged:    HH Agency:     Status of Service:  In process, will continue to follow  If discussed at Long Length of Stay Meetings, dates discussed:    Additional Comments:  Bethena Roys, RN 06/18/2016, 11:51 AM

## 2016-06-19 DIAGNOSIS — I10 Essential (primary) hypertension: Secondary | ICD-10-CM | POA: Diagnosis not present

## 2016-06-19 DIAGNOSIS — N186 End stage renal disease: Secondary | ICD-10-CM | POA: Diagnosis not present

## 2016-06-19 DIAGNOSIS — R10814 Left lower quadrant abdominal tenderness: Secondary | ICD-10-CM | POA: Diagnosis not present

## 2016-06-19 DIAGNOSIS — R079 Chest pain, unspecified: Secondary | ICD-10-CM | POA: Diagnosis not present

## 2016-06-19 DIAGNOSIS — I248 Other forms of acute ischemic heart disease: Secondary | ICD-10-CM | POA: Diagnosis not present

## 2016-06-19 LAB — RENAL FUNCTION PANEL
Albumin: 3.1 g/dL — ABNORMAL LOW (ref 3.5–5.0)
Anion gap: 15 (ref 5–15)
BUN: 46 mg/dL — ABNORMAL HIGH (ref 6–20)
CHLORIDE: 100 mmol/L — AB (ref 101–111)
CO2: 21 mmol/L — AB (ref 22–32)
CREATININE: 5.86 mg/dL — AB (ref 0.61–1.24)
Calcium: 9.3 mg/dL (ref 8.9–10.3)
GFR, EST AFRICAN AMERICAN: 9 mL/min — AB (ref >60)
GFR, EST NON AFRICAN AMERICAN: 8 mL/min — AB (ref >60)
Glucose, Bld: 106 mg/dL — ABNORMAL HIGH (ref 65–99)
POTASSIUM: 4.2 mmol/L (ref 3.5–5.1)
Phosphorus: 5.6 mg/dL — ABNORMAL HIGH (ref 2.5–4.6)
Sodium: 136 mmol/L (ref 135–145)

## 2016-06-19 LAB — CBC
HEMATOCRIT: 30.9 % — AB (ref 39.0–52.0)
Hemoglobin: 10.4 g/dL — ABNORMAL LOW (ref 13.0–17.0)
MCH: 31 pg (ref 26.0–34.0)
MCHC: 33.7 g/dL (ref 30.0–36.0)
MCV: 92 fL (ref 78.0–100.0)
PLATELETS: 227 10*3/uL (ref 150–400)
RBC: 3.36 MIL/uL — AB (ref 4.22–5.81)
RDW: 13.4 % (ref 11.5–15.5)
WBC: 8.5 10*3/uL (ref 4.0–10.5)

## 2016-06-19 LAB — PROTIME-INR
INR: 1.07
Prothrombin Time: 14 seconds (ref 11.4–15.2)

## 2016-06-19 MED ORDER — SODIUM CHLORIDE 0.9 % IV SOLN: 100.0000 mL | INTRAVENOUS | Status: DC | PRN

## 2016-06-19 MED ORDER — HEPARIN SODIUM (PORCINE) 1000 UNIT/ML DIALYSIS: 4000.0000 [IU] | INTRAMUSCULAR | Status: DC | PRN

## 2016-06-19 MED ORDER — DOXERCALCIFEROL 4 MCG/2ML IV SOLN
INTRAVENOUS | Status: AC
  Filled 2016-06-19: qty 2

## 2016-06-19 MED ORDER — HEPARIN SODIUM (PORCINE) 1000 UNIT/ML DIALYSIS
1000.0000 [IU] | INTRAMUSCULAR | Status: DC | PRN
  Filled 2016-06-19: qty 1

## 2016-06-19 MED ORDER — LIDOCAINE-PRILOCAINE 2.5-2.5 % EX CREA: 1.0000 "application " | TOPICAL_CREAM | CUTANEOUS | Status: DC | PRN

## 2016-06-19 MED ORDER — ALTEPLASE 2 MG IJ SOLR: 2.0000 mg | Freq: Once | INTRAMUSCULAR | Status: DC | PRN

## 2016-06-19 MED ORDER — HEPARIN SODIUM (PORCINE) 1000 UNIT/ML DIALYSIS: 1000.0000 [IU] | INTRAMUSCULAR | Status: DC | PRN

## 2016-06-19 MED ORDER — LIDOCAINE-PRILOCAINE 2.5-2.5 % EX CREA
1.0000 "application " | TOPICAL_CREAM | CUTANEOUS | Status: DC | PRN
  Filled 2016-06-19: qty 5

## 2016-06-19 MED ORDER — PENTAFLUOROPROP-TETRAFLUOROETH EX AERO: 1.0000 "application " | INHALATION_SPRAY | CUTANEOUS | Status: DC | PRN

## 2016-06-19 MED ORDER — WARFARIN SODIUM 5 MG PO TABS: 5.0000 mg | ORAL_TABLET | Freq: Once | ORAL | Status: DC

## 2016-06-19 MED ORDER — LIDOCAINE HCL (PF) 1 % IJ SOLN: 5.0000 mL | INTRAMUSCULAR | Status: DC | PRN

## 2016-06-19 MED ORDER — WARFARIN SODIUM 2 MG PO TABS: 2.0000 mg | ORAL_TABLET | Freq: Every day | ORAL | 0 refills | Status: AC

## 2016-06-19 MED ORDER — HEPARIN SODIUM (PORCINE) 1000 UNIT/ML DIALYSIS: 20.0000 [IU]/kg | INTRAMUSCULAR | Status: DC | PRN

## 2016-06-19 MED ORDER — ONDANSETRON HCL 4 MG PO TABS: 4.0000 mg | ORAL_TABLET | Freq: Three times a day (TID) | ORAL | 0 refills | Status: AC | PRN

## 2016-06-19 MED ORDER — ENOXAPARIN SODIUM 80 MG/0.8ML ~~LOC~~ SOLN: 70.0000 mg | SUBCUTANEOUS | Status: DC

## 2016-06-19 MED ORDER — PANTOPRAZOLE SODIUM 40 MG PO TBEC: 40.0000 mg | DELAYED_RELEASE_TABLET | Freq: Every day | ORAL | 0 refills | Status: AC

## 2016-06-19 NOTE — Discharge Instructions (Signed)
Follow with Primary MD Kandice Hams, MD , also follow with your gastroenterologist within 1 week if needed in 7 days   Get CBC, BMP, INR checked  by Primary MD 3 days ( we routinely change or add medications that can affect your baseline labs and fluid status, therefore we recommend that you get the mentioned basic workup next visit with your PCP, your PCP may decide not to get them or add new tests based on their clinical decision)   Activity: As tolerated with Full fall precautions use walker/cane & assistance as needed   Disposition Home     Diet:   Renal - Low Salt Diet and 1.2 lit/day fluid restriction.   On your next visit with your primary care physician please Get Medicines reviewed and adjusted.   Please request your Prim.MD to go over all Hospital Tests and Procedure/Radiological results at the follow up, please get all Hospital records sent to your Prim MD by signing hospital release before you go home.   If you experience worsening of your admission symptoms, develop shortness of breath, life threatening emergency, suicidal or homicidal thoughts you must seek medical attention immediately by calling 911 or calling your MD immediately  if symptoms less severe.  You Must read complete instructions/literature along with all the possible adverse reactions/side effects for all the Medicines you take and that have been prescribed to you. Take any new Medicines after you have completely understood and accpet all the possible adverse reactions/side effects.   Do not drive, operate heavy machinery, perform activities at heights, swimming or participation in water activities or provide baby sitting services if your were admitted for syncope or siezures until you have seen by Primary MD or a Neurologist and advised to do so again.  Do not drive when taking Pain medications.    Do not take more than prescribed Pain, Sleep and Anxiety Medications  Special Instructions: If you have  smoked or chewed Tobacco  in the last 2 yrs please stop smoking, stop any regular Alcohol  and or any Recreational drug use.  Wear Seat belts while driving.   Please note  You were cared for by a hospitalist during your hospital stay. If you have any questions about your discharge medications or the care you received while you were in the hospital after you are discharged, you can call the unit and asked to speak with the hospitalist on call if the hospitalist that took care of you is not available. Once you are discharged, your primary care physician will handle any further medical issues. Please note that NO REFILLS for any discharge medications will be authorized once you are discharged, as it is imperative that you return to your primary care physician (or establish a relationship with a primary care physician if you do not have one) for your aftercare needs so that they can reassess your need for medications and monitor your lab values.   Dialysis Diet Dialysis is a treatment that you may undergo if you have significant damage to your kidneys. Dialysis replaces some of the work that the kidneys do. One of the jobs that it takes over is removing wastes, salt, and extra water from your blood. This helps to keep the amount of potassium and other nutrients in your blood at healthy levels. When you need dialysis, it is important to pay careful attention to your diet. Between dialysis sessions, certain nutrients can build up in your blood and cause you to get sick. Vitamins and  minerals are an important part of a healthy diet and should not be avoided entirely. However, it is commonly recommended that you limit your intake of potassium, phosphorus, and sodium. It may also be necessary to restrict other nutrients, such as carbohydrates or fat, if you have other health conditions. Your health care provider or dietitian can help you to determine the amount of these nutrients that is right for you. WHAT IS  MY PLAN? Your dietitian will help you to design a meal plan that is specific to your needs. Generally, meal plans include:  Grains, 6-11 servings per day. One serving is equal to 1 slice of bread or  cup of cooked rice or pasta.  Low-potassium vegetables, 2-3 servings per day. One serving is equal to  cup.  Low-potassium fruits, 2-3 servings per day. One serving is equal to  cup.  Meats and other protein sources, 8-11 oz per day.  Dairy,  cup per day. Your dietitian will provide you with specific instructions about the amount of fluids you can have each day. WHAT DO I NEED TO KNOW ABOUT A DIALYSIS DIET?  Limit your intake of potassium. Potassium is found in milk, fruits, and vegetables.  Limit your intake of phosphorus. Phosphorus is found in milk, cheese, beans, nuts, and carbonated beverages. Avoid whole-grain and high-fiber foods because they contain high amounts of phosphorus.  Limit your intake of sodium. Foods that are high in sodium include processed and cured meats, ready-made frozen meals, canned vegetables, and salty snack foods. Do not use salt substitutes because they contain potassium.  If you were instructed to restrict your fluid intake, follow your health care provider's specific instructions. You may be told to:  Write down what you drink and any foods you eat that are made mostly from water, such as gelatin and soups.  Drink from small cups to help control how much you drink.  Ask your health care provider if you should regularly take an over-the-counter medicine that binds phosphorus, such as antacid products that contain calcium carbonate.  Take vitamin and mineral supplements only as directed by your health care provider.  Eat high-quality proteins, such as meat, poultry, fish, and eggs. Limit low-quality plant-based proteins, such as nuts and beans.  Cut potatoes into small pieces and boil them in unsalted water before you eat them. This can help to remove  some potassium from the potato.  Drain all fluid from cooked vegetables and canned fruits before eating them. WHAT FOODS CAN I EAT? Grains White bread. White rice. Cooked cereal. Unsalted popcorn. Tortillas. Pasta. Vegetables Fresh or frozen broccoli, carrots, and green beans. Cabbage. Cauliflower. Celery. Cucumbers. Eggplant. Radishes. Zucchini. Fruits Apples. Fresh or frozen berries. Fresh or canned pears, peaches, and pineapple. Grapes. Plums. Meats and Other Protein Sources Fresh or frozen beef, pork, chicken, and fish. Eggs. Low-sodium canned tuna or salmon. Dairy Cream cheese. Heavy cream. Ricotta cheese. Beverages Apple cider. Cranberry juice. Grape juice. Lemonade. Black coffee. Condiments Herbs. Spices. Jam and jelly. Honey. Sweets and Desserts Sherbet. Cakes. Cookies. Fats and Oils Olive oil, canola oil, and safflower oil. Other Non-dairy creamer. Non-dairy whipped topping. Homemade broth without salt. The items listed above may not be a complete list of recommended foods or beverages. Contact your dietitian for more options.  WHAT FOODS ARE NOT RECOMMENDED? Grains Whole-grain bread. Whole-grain pasta. High-fiber cereal. Vegetables Potatoes. Beets. Tomatoes. Winter squash and pumpkin. Asparagus. Spinach. Parsnips. Fruits Star fruit. Bananas. Oranges. Kiwi. Nectarines. Prunes. Melon. Dried fruit. Avocado. Meats and Other  Protein Sources Canned, smoked, and cured meats. Soil scientist. Sardines. Nuts and seeds. Peanut butter. Beans and legumes. Dairy Milk. Buttermilk. Yogurt. Cheese and cottage cheese. Processed cheese spreads. Beverages Orange juice. Prune juice. Carbonated soft drinks. Condiments Salt. Salt substitutes. Soy sauce. Sweets and Desserts Ice cream. Chocolate. Candied nuts. Fats and Oils Butter. Margarine. Other Ready-made frozen meals. Canned soups. The items listed above may not be a complete list of foods and beverages to avoid. Contact  your dietitian for more information.   This information is not intended to replace advice given to you by your health care provider. Make sure you discuss any questions you have with your health care provider.   Document Released: 06/14/2004 Document Revised: 10/08/2014 Document Reviewed: 04/20/2014 Elsevier Interactive Patient Education [employer].

## 2016-06-19 NOTE — Procedures (Signed)
Stress test not high risk for ischemia per cards, no further w/u planned. On HD now.     I was present at this dialysis session, have reviewed the session itself and made  appropriate changes Kelly Splinter MD [place] pager [phone]    cell [phone] 06/19/2016, 11:53 AM

## 2016-06-19 NOTE — Progress Notes (Signed)
PT Cancellation Note  Patient Details Name: [name] MRN: [id number] DOB: [birth year]   Cancelled Treatment:    Reason Eval/Treat Not Completed: Patient at procedure or test/unavailable.  Pt is in HD.  PT will attempt to try back after HD treatment as time allows.   Thanks,    Barbarann Ehlers. [place], [place], DPT [phone]   06/19/2016, 11:05 AM

## 2016-06-19 NOTE — Progress Notes (Signed)
OT Cancellation Note  Patient Details Name: [name] MRN: [id number] DOB: 03-Jun-1933   Cancelled Treatment:    Reason Eval/Treat Not Completed: Patient at procedure or test/ unavailable;Other (comment) (pt still ay dailysis) Will re attempt tomorrow  [name] 06/19/2016, 2:26 PM

## 2016-06-19 NOTE — Progress Notes (Addendum)
Patient Name: [name] Date of Encounter: 06/19/2016  Primary Cardiologist: Dr. Laurena Bering Problem List     Active Problems:   ESRD (end stage renal disease) ([place])   Pain in the chest   Essential hypertension   Lower abdominal pain   Hypokalemia   Left lower quadrant abdominal tenderness     Subjective   Feels well, no pain. Wants to go home. Pleasant  Inpatient Medications    Scheduled Meds: . cinacalcet  90 mg Oral Q breakfast  . darbepoetin (ARANESP) injection - DIALYSIS  25 mcg Intravenous Q Tue-HD  . doxercalciferol  3 mcg Intravenous Q T,Th,Sa-HD  . enoxaparin (LOVENOX) injection  70 mg Subcutaneous Q24H  . feeding supplement (NEPRO CARB STEADY)  237 mL Oral BID BM  . lanthanum  1,000 mg Oral TID WC  . multivitamin  1 tablet Oral QHS  . pantoprazole  40 mg Oral Daily  . senna  1 tablet Oral BID  . sodium chloride flush  3 mL Intravenous Q12H  . sodium chloride flush  3 mL Intravenous Q12H  . Warfarin - Pharmacist Dosing Inpatient   Does not apply q1800   Continuous Infusions:  PRN Meds:.sodium chloride, acetaminophen **OR** acetaminophen, albuterol, HYDROcodone-acetaminophen, sodium chloride flush   Vital Signs    Vitals:   06/18/16 0955 06/18/16 1352 06/18/16 2223 06/19/16 0500  BP: (!) 116/51 (!) 132/53 121/77 133/65  Pulse: 70 (!) 131 60 70  Resp:  15 16   Temp:  98.1 F (36.7 C) 98.6 F (37 C) 98.2 F (36.8 C)  TempSrc:  Oral Oral Oral  SpO2:  98% 98% 97%  Weight:      Height:        Intake/Output Summary (Last 24 hours) at 06/19/16 0851 Last data filed at 06/18/16 1830  Gross per 24 hour  Intake              483 ml  Output                0 ml  Net              483 ml   Filed Weights   06/16/16 2030 06/16/16 2144 06/17/16 0509  Weight: 185 lb 3 oz (84 kg) 156 lb 8 oz (71 kg) 155 lb 1.6 oz (70.4 kg)    Physical Exam    GEN: elderly in no acute distress.  HEENT: Grossly normal.  Neck: Supple, no JVD, carotid bruits, or  masses. Cardiac: irreg irreg, 2/6 S murmur, no rubs, or gallops. No clubbing, cyanosis, edema.  Radials/DP/PT 2+ and equal bilaterally.  Respiratory:  Respirations regular and unlabored, clear to auscultation bilaterally. GI: Soft, nontender, nondistended, BS + x 4. MS: no deformity or atrophy. Skin: warm and dry, no rash. Neuro:  Strength and sensation are intact. Psych: AAOx3.  Normal affect.  Labs    CBC  Recent Labs  06/16/16 1544 06/17/16 0019  WBC 7.1 8.2  HGB 11.6* 10.9*  HCT 35.6* 32.3*  MCV 94.4 93.4  PLT 227 [id number]   Basic Metabolic Panel  Recent Labs  06/17/16 0019 06/17/16 0628 06/18/16 0423  NA 139  --  138  K 3.6  --  4.3  CL 99*  --  100*  CO2 27  --  25  GLUCOSE 96  --  82  BUN 20  --  35*  CREATININE 3.13*  --  4.75*  CALCIUM 10.6*  --  9.4  MG 2.1 2.1  --  PHOS  --  5.1*  --    Liver Function Tests  Recent Labs  06/17/16 0019  AST 17  ALT 11*  ALKPHOS 117  BILITOT 0.4  PROT 5.8*  ALBUMIN 3.0*   No results for input(s): LIPASE, AMYLASE in the last 72 hours. Cardiac Enzymes  Recent Labs  06/16/16 2100 06/17/16 0019 06/17/16 0628  TROPONINI 0.04* 0.04* 0.05*   BNP Invalid input(s): POCBNP D-Dimer No results for input(s): DDIMER in the last 72 hours. Hemoglobin A1C  Recent Labs  06/17/16 0019  HGBA1C 5.2   Fasting Lipid Panel No results for input(s): CHOL, HDL, LDLCALC, TRIG, CHOLHDL, LDLDIRECT in the last 72 hours. Thyroid Function Tests  Recent Labs  06/17/16 0019  TSH 7.605*    Telemetry    No adverse rhythms, AFIB noted - Personally Reviewed  ECG    AFIB rate controlled - Personally Reviewed  Radiology    Nm Myocar Multi W/spect W/wall Motion / Ef  Result Date: 06/18/2016  There was no ST segment deviation noted during stress.  Defect 1: There is a small defect of mild severity present in the basal inferior and mid inferior location.  Findings consistent with ischemia.  This is an intermediate risk  study.  The left ventricular ejection fraction is mildly decreased (45% based on the today's echo study).  There is small area mild severity reversible defect in the basal and mid inferior wall (SDS =4).     Cardiac Studies   NUC as above ECHO 06/18/16: - Left ventricle: The cavity size was normal. There was moderate   concentric hypertrophy. Systolic function was mildly reduced. The   estimated ejection fraction was in the range of 45% to 50%. Wall   motion was normal; there were no regional wall motion   abnormalities. The study was not technically sufficient to allow   evaluation of LV diastolic dysfunction due to atrial   fibrillation. - Aortic valve: Trileaflet; moderately thickened, moderately   calcified leaflets. There was moderate regurgitation. - Mitral valve: There was moderate regurgitation directed   eccentrically and posteriorly. - Left atrium: The atrium was severely dilated. - Right ventricle: The cavity size was mildly dilated. Wall   thickness was normal. - Right atrium: The atrium was mildly dilated. - Tricuspid valve: There was moderate regurgitation. - Pulmonic valve: There was moderate regurgitation. - Pulmonary arteries: PA peak pressure: 43 mm Hg (S).  Impressions:  - The right ventricular systolic pressure was increased consistent   with moderate pulmonary hypertension.  Patient Profile     82 yo male w/ hx AAA repair w/ stent graft, HTN, ESRD on HD, ?hx PAF admitted 09/16 w/ LLL quadrant abd/chest pain, Afib seen on ECG, minimal trop elevation  Assessment & Plan    Atypical chest pain - sharp in character, does not appear to be cardiac in origin  -nuclear stress test - NO high risk ischemia.   - I do not think that he had true ACS. When asked about his discomfort, he points to his left lower abdomen which has improved. Sharp pain. Very atypical. The pain sometimes radiated up to his left diaphragm region.  - ECHO - EF 45%, mildly  reduced   Elevated troponin - Likely demand ischemia given its low level 0.04/0.05 with no peak or trend. NUC stress reassuring.   Atrial fibrillation - atrial fibrillation diagnosed previously but had been unreliable as far as taking his Coumadin and had difficulty maintaining a therapeutic INR according to his nephew.  He has a bottle of Coumadin at home. - My recommendation is to utilize warfarin given his end-stage renal disease. - We can help monitor him at our clinic if necessary but I would recommend his PCP. He states that he has not been seeing a primary care physician regularly. He truly needs to establish care with one. Dr. Delfina Redwood has been listed. We will set up follow-up in our clinic as well.  - spoke with his niece.   Moderate AI  - should be of little clinical consequence at this point.   End-stage renal disease - Hemodialysis.  Peripheral vascular disease/aortic atherosclerosis - Seen incidentally on CT scan of abdomen  Status post abdominal aortic aneurysm repair - Dr. Trula Slade  Occasional persistent vomiting - Per primary team.   OK for DC  Signed, Candee Furbish, MD  06/19/2016, 8:51 AM

## 2016-06-19 NOTE — Progress Notes (Signed)
[name] for lovenox and warfarin Indication: chest pain/ACS/afib  Allergies  Allergen Reactions  . Erythromycin Other (See Comments)    Pt states "ALL MYCINS" (caused extreme stomach pain)    Patient Measurements: Height: 5\' 10"  (177.8 cm) Weight:  (refused weight this AM) IBW/kg (Calculated) : 73 Heparin Dosing Weight:   Vital Signs: Temp: 98.2 F (36.8 C) (09/19 0500) Temp Source: Oral (09/19 0500) BP: 133/65 (09/19 0500) Pulse Rate: 70 (09/19 0500)  Labs:  Recent Labs  06/16/16 1544 06/16/16 2100 06/17/16 0019 06/17/16 0628 06/18/16 0423 06/19/16 0318  HGB 11.6*  --  10.9*  --   --   --   HCT 35.6*  --  32.3*  --   --   --   PLT 227  --  211  --   --   --   LABPROT  --   --   --   --  14.4 14.0  INR  --   --   --   --  1.12 1.07  CREATININE 2.49*  --  3.13*  --  4.75*  --   TROPONINI  --  0.04* 0.04* 0.05*  --   --     Estimated Creatinine Clearance: 12.1 mL/min (by C-G formula based on SCr of 4.75 mg/dL (H)).   Medical History: Past Medical History:  Diagnosis Date  . AAA (abdominal aortic aneurysm) ([place]) 2009   Ruptured AAA  . Arthritis   . Cancer [facility])    Prostate cancer  . Chronic kidney disease   . Claudication ([place])   . Hemodialysis patient ([place])   . History of thromboembolism   . Hyperlipidemia   . Hypertension     Medications:  Scheduled:  . cinacalcet  90 mg Oral Q breakfast  . doxercalciferol  3 mcg Intravenous Q T,Th,Sa-HD  . enoxaparin (LOVENOX) injection  70 mg Subcutaneous Q24H  . feeding supplement (NEPRO CARB STEADY)  237 mL Oral BID BM  . lanthanum  1,000 mg Oral TID WC  . multivitamin  1 tablet Oral QHS  . pantoprazole  40 mg Oral Daily  . senna  1 tablet Oral BID  . sodium chloride flush  3 mL Intravenous Q12H  . sodium chloride flush  3 mL Intravenous Q12H  . Warfarin - Pharmacist Dosing Inpatient   Does not apply q1800   Infusions:     Assessment: 83 yo who presented with CP  after being on HD for 3 hrs. He was found to be in afib.  He is on lovenox and coumadin for anticoagulation -INR= 1.07 on day 3 of coumadin -Patient refused lovenox on 9/18  Goal of Therapy:  Heparin level 0.6-1 units/ml Monitor platelets by anticoagulation protocol: Yes   Plan:  -Will reschedule lovenox to be given earlier today -Warfarin 5mg  x1 tonight -CBC q3day while on lovenox -Daily INR  [name], Pharm D 06/19/2016 10:38 AM

## 2016-06-19 NOTE — Discharge Summary (Signed)
[name] [id number] DOB: [birth year] DOA: 06/16/2016  PCP: Kandice Hams, MD  Admit date: 06/16/2016  Discharge date: 06/19/2016  Admitted From: Home   Disposition:  Home   Recommendations for Outpatient Follow-up:   Follow up with PCP in 1-2 weeks  PCP Please obtain BMP/CBC, 2 view CXR in 1week,  (see Discharge instructions)   PCP Please follow up on the following pending results: Monitor INR closely   Home Health: None   Equipment/Devices: None  Consultations: Cards,Renal Discharge Condition: Fair   CODE STATUS: Full   Diet Recommendation: Renal   Chief Complaint  Patient presents with  . Chest Pain     Brief history of present illness from the day of admission and additional interim summary    [name] a 82 y.o.malewith medical history significant of AAA sp repair, HTN, ESRD on HD presented with chest pain left side. Patient admitted to rule out ACS, troponins with mild elevation and EKG shows A. fib with right bundle branch block and no ST segment changes. Cardiology was consulted recommended nuclear stress test.    Hospital issues addressed     Chest pain - resolved - atypical chest pain, although patient with high risk factors, HEART score 4. Troponin trend and non-ACS pattern, currently chest pain-free, has underwent TTE & stress test on 06/18/2016 both stable. DC home per Cards.  Chronic Atrial fibrillation - HR controlled [place] vasc 2 score of at least 3 - Seems that he has had atrial fibrillation diagnosed previously but had been unreliable as far as taking his Coumadin and had difficulty maintaining a therapeutic INR according to his nephew. He has a bottle of Coumadin at home. Will start warfarin given patient with ESRD, Continue telemetry monitoring.  Follow with PCP for  INR monitor, refused Lovenox overlap..  Essential hypertension stable, Monitor BP if elevated consider antihypertensive medications   Lower abdominal pain & Intermittent N&V - Resolved, no clear etiology at this point. CT abdomen with no acute findings. Patient was offered GI input, he refused, recommend PCP for outpt workup.   Hypokalemia - Resolved  ESRD (end stage renal disease) ([place]) - as per nephrology, notified that patient has been admitted, TTS schedule  Long QT will avoid QT prolonging medication monitoring telemetry   Status post abdominal aortic aneurysm repair - follow with  Dr. Trula Slade     Discharge diagnosis     Active Problems:   ESRD (end stage renal disease) ([place])   Pain in the chest   Essential hypertension   Lower abdominal pain   Hypokalemia   Left lower quadrant abdominal tenderness    Discharge instructions    Discharge Instructions    Discharge instructions    Complete by:  As directed    Follow with Primary MD Kandice Hams, MD , also follow with your gastroenterologist within 1 week if needed in 2-3 days   Get CBC, BMP, INR checked  by Primary MD 2-3 days ( we routinely change or add medications that can affect your baseline labs  and fluid status, therefore we recommend that you get the mentioned basic workup next visit with your PCP, your PCP may decide not to get them or add new tests based on their clinical decision)   Activity: As tolerated with Full fall precautions use walker/cane & assistance as needed   Disposition Home     Diet:   Renal - Low Salt Diet and 1.2 lit/day fluid restriction.   On your next visit with your primary care physician please Get Medicines reviewed and adjusted.   Please request your Prim.MD to go over all Hospital Tests and Procedure/Radiological results at the follow up, please get all Hospital records sent to your Prim MD by signing hospital release before you go home.   If you experience worsening  of your admission symptoms, develop shortness of breath, life threatening emergency, suicidal or homicidal thoughts you must seek medical attention immediately by calling 911 or calling your MD immediately  if symptoms less severe.  You Must read complete instructions/literature along with all the possible adverse reactions/side effects for all the Medicines you take and that have been prescribed to you. Take any new Medicines after you have completely understood and accpet all the possible adverse reactions/side effects.   Do not drive, operate heavy machinery, perform activities at heights, swimming or participation in water activities or provide baby sitting services if your were admitted for syncope or siezures until you have seen by Primary MD or a Neurologist and advised to do so again.  Do not drive when taking Pain medications.    Do not take more than prescribed Pain, Sleep and Anxiety Medications  Special Instructions: If you have smoked or chewed Tobacco  in the last 2 yrs please stop smoking, stop any regular Alcohol  and or any Recreational drug use.  Wear Seat belts while driving.   Please note  You were cared for by a hospitalist during your hospital stay. If you have any questions about your discharge medications or the care you received while you were in the hospital after you are discharged, you can call the unit and asked to speak with the hospitalist on call if the hospitalist that took care of you is not available. Once you are discharged, your primary care physician will handle any further medical issues. Please note that NO REFILLS for any discharge medications will be authorized once you are discharged, as it is imperative that you return to your primary care physician (or establish a relationship with a primary care physician if you do not have one) for your aftercare needs so that they can reassess your need for medications and monitor your lab values.   Increase activity  slowly    Complete by:  As directed       Discharge Medications     Medication List    STOP taking these medications   ibuprofen 200 MG tablet Commonly known as:  ADVIL,MOTRIN     TAKE these medications   cinacalcet 90 MG tablet Commonly known as:  SENSIPAR Take 90 mg by mouth daily.   ondansetron 4 MG tablet Commonly known as:  ZOFRAN Take 1 tablet (4 mg total) by mouth every 8 (eight) hours as needed for nausea or vomiting.   pantoprazole 40 MG tablet Commonly known as:  PROTONIX Take 1 tablet (40 mg total) by mouth daily.   warfarin 2 MG tablet Commonly known as:  COUMADIN Take 1 tablet (2 mg total) by mouth daily.  Follow-up Information    POLITE,RONALD D, MD. Schedule an appointment as soon as possible for a visit in 2 day(s).   Specialty:  Internal Medicine Why:  Get CBC, BMP and INR checked in 3 days, also follow with her gastroenterologist as needed Contact information: [address] [place] [address] [phone]        Mark Skains, MD. Schedule an appointment as soon as possible for a visit in 1 week(s).   Specialty:  Cardiology Contact information: [id number] [place] [address] [phone]           Major procedures and Radiology Reports - PLEASE review detailed and final reports thoroughly  -        ECHO -  Left ventricle: The cavity size was normal. There was moderateconcentric hypertrophy. Systolic function was mildly reduced. Theestimated ejection fraction was in the range of 45% to 50%. Wallmotion was normal; there were no regional wall motionabnormalities. The study was not technically sufficient to allowevaluation of LV diastolic dysfunction due to atrial fibrillation. - Aortic valve: Trileaflet; moderately thickened, moderatelycalcified leaflets. There was moderate regurgitation. - Mitral valve: There was moderate regurgitation directedeccentrically and posteriorly. - Left  atrium: The atrium was severely dilated. - Right ventricle: The cavity size was mildly dilated. Wallthickness was normal. - Right atrium: The atrium was mildly dilated. - Tricuspid valve: There was moderate regurgitation. - Pulmonic valve: There was moderate regurgitation. - Pulmonary arteries: PA peak pressure: 43 mm Hg (S).  Impressions: - The right ventricular systolic pressure was increased consistent with moderate pulmonary hypertension.      Dg Chest 2 View  Result Date: 06/16/2016 CLINICAL DATA:  Chest pain EXAM: CHEST  2 VIEW COMPARISON:  10/25/2015 chest radiograph. FINDINGS: Stable cardiomediastinal silhouette with normal heart size and aortic atherosclerosis. No pneumothorax. No pleural effusion. Hyperinflated lungs. Emphysema. Calcified pleural plaques bilaterally appear unchanged. No acute consolidative airspace disease. No pulmonary edema. IMPRESSION: 1. No acute cardiopulmonary disease. 2. Hyperinflated lungs and emphysema, suggesting COPD. 3. Calcified pleural plaques bilaterally, probably asbestos related. 4. Aortic atherosclerosis. Electronically Signed   By: Ilona Sorrel M.D.   On: 06/16/2016 18:20   Ct Abdomen Pelvis W Contrast  Result Date: 06/16/2016 CLINICAL DATA:  Onset of nonradiating LEFT side chest pain today during dialysis, total of 3 episodes, intense pain without associated symptoms, prior endovascular repair aorta, end-stage renal disease on dialysis, hypertension, former smoker EXAM: CT ABDOMEN AND PELVIS WITH CONTRAST TECHNIQUE: Multidetector CT imaging of the abdomen and pelvis was performed using the standard protocol following bolus administration of intravenous contrast. Sagittal and coronal MPR images reconstructed from axial data set. CONTRAST:  100 cc Isovue 300 IV.  No oral contrast administered. COMPARISON:  04/24/2010 FINDINGS: Lower chest: Pleural calcifications and thickening at the lung bases bilaterally. Minimal pleural effusions bilaterally.  Lungs clear. Hepatobiliary: Gallbladder normal appearance. Question minimally nodular hepatic contours. No focal hepatic mass lesion. Pancreas: Few scattered pancreatic parenchymal calcifications. No pancreatic mass Spleen: Normal appearance Adrenals/Urinary Tract: Marked BILATERAL renal cortical atrophy and cystic change compatible with end-stage renal disease and dialysis. No hydronephrosis. Bladder decompressed. Ureters unremarkable. Stomach/Bowel: Stomach decompressed, poorly assessed. Distal descending and sigmoid diverticulosis without evidence of diverticulitis. Large and small bowel loops otherwise normal. Normal appendix. Vascular/Lymphatic: Extensive atherosclerotic calcification. Prior aortic stenting into the common iliac arteries bilaterally. Native aneurysm sac measures 4.4 x 3.9 cm image 39 previously 4.5 x 4.7 cm at same level. No definite evidence of endoleak or surrounding  hemorrhage. Extensive atherosclerotic calcifications at the origins of the renal arteries bilaterally, celiac artery and SMA. No adenopathy. Reproductive: N/A Other: No free air, free fluid, hernia or acute inflammatory process. Musculoskeletal: Osseous demineralization without acute bony abnormality. IMPRESSION: Extensive atherosclerotic disease post endoluminal stenting of abdominal aortic aneurysm. Interval decrease in size of native aneurysm sac since 2011. Significant atherosclerotic calcification at the origins of the renal arteries, celiac artery and SMA. Distal colonic diverticulosis. Atrophic kidneys with extensive cystic change compatible with end-stage renal disease on dialysis. Question prior asbestos exposure with tiny pleural effusions. No acute intra-abdominal or intrapelvic abnormalities. Electronically Signed   By: Lavonia Dana M.D.   On: 06/16/2016 21:48   Nm Myocar Multi W/spect W/wall Motion / Ef  Result Date: 06/18/2016  There was no ST segment deviation noted during stress.  Defect 1: There is a small  defect of mild severity present in the basal inferior and mid inferior location.  Findings consistent with ischemia.  This is an intermediate risk study.  The left ventricular ejection fraction is mildly decreased (45% based on the today's echo study).  There is small area mild severity reversible defect in the basal and mid inferior wall (SDS =4).    Micro Results     Recent Results (from the past 240 hour(s))  MRSA PCR Screening     Status: None   Collection Time: 06/17/16  7:26 AM  Result Value Ref Range Status   MRSA by PCR NEGATIVE NEGATIVE Final    Comment:        The GeneXpert MRSA Assay (FDA approved for NASAL specimens only), is one component of a comprehensive MRSA colonization surveillance program. It is not intended to diagnose MRSA infection nor to guide or monitor treatment for MRSA infections.     Today   Subjective    Ellin Goodie today has no headache,no chest abdominal pain,no new weakness tingling or numbness, feels much better wants to go home today.     Objective   Blood pressure 133/65, pulse 70, temperature 98.2 F (36.8 C), temperature source Oral, resp. rate 16, height 5\' 10"  (1.778 m), weight 70.4 kg (155 lb 1.6 oz), SpO2 97 %.   Intake/Output Summary (Last 24 hours) at 06/19/16 0917 Last data filed at 06/18/16 1830  Gross per 24 hour  Intake              483 ml  Output                0 ml  Net              483 ml    Exam Awake Alert, Oriented x 3, No new F.N deficits, Normal affect [place].AT,PERRAL Supple Neck,No JVD, No cervical lymphadenopathy appriciated.  Symmetrical Chest wall movement, Good air movement bilaterally, CTAB RRR,No Gallops,Rubs or new Murmurs, No Parasternal Heave +ve B.Sounds, Abd Soft, Non tender, No organomegaly appriciated, No rebound -guarding or rigidity. No Cyanosis, Clubbing or edema, No new Rash or bruise   Data Review   CBC w Diff: Lab Results  Component Value Date   WBC 8.2 06/17/2016   HGB 10.9 (L)  06/17/2016   HCT 32.3 (L) 06/17/2016   PLT 211 06/17/2016   LYMPHOPCT 12 10/25/2015   MONOPCT 12 10/25/2015   EOSPCT 3 10/25/2015   BASOPCT 0 10/25/2015    CMP: Lab Results  Component Value Date   NA 138 06/18/2016   K 4.3 06/18/2016   CL 100 (L) 06/18/2016  CO2 25 06/18/2016   BUN 35 (H) 06/18/2016   CREATININE 4.75 (H) 06/18/2016   PROT 5.8 (L) 06/17/2016   ALBUMIN 3.0 (L) 06/17/2016   BILITOT 0.4 06/17/2016   ALKPHOS 117 06/17/2016   AST 17 06/17/2016   ALT 11 (L) 06/17/2016  . Lab Results  Component Value Date   INR 1.07 06/19/2016   INR 1.12 06/18/2016   INR 1.04 10/25/2015     Total Time in preparing paper work, data evaluation and todays exam - 35 minutes  Thurnell Lose M.D on 06/19/2016 at 9:17 AM  [facility]   Office  [phone]

## 2016-06-19 NOTE — Progress Notes (Signed)
OT Cancellation Note  Patient Details Name: [name] MRN: [id number] DOB: [birth year]   Cancelled Treatment:    Reason Eval/Treat Not Completed: Patient at procedure or test/ unavailable. Pt is in HD. Will attempt later after HD treatment as time allows/as appropriate.    Emmit [facility]ers [facility] 06/19/2016, 11:25 AM

## 2016-07-03 ENCOUNTER — Encounter: Payer: Self-pay | Admitting: Physician Assistant

## 2016-07-03 NOTE — Progress Notes (Deleted)
Cardiology Office Note    Date:  07/03/2016   ID:  [name], DOB November 06, 1932, MRN [id number]  PCP:  Kandice Hams, MD  Cardiologist:  Dr. Tamala Julian  CC: post hospital follow up   History of Present Illness:  [name] is a 82 y.o. male with a history of AAA repair w/ stent graft, significant PAD, HTN, ESRD on HD,  PAF who presents to clinic for post hospital follow up.   He is status post endovascular repair of a ruptured abdominal aortic aneurysm on 03/30/2008 using a Gore Excluder device.  He was recently admitted from 09/16-9/19/17 w/ LLL quadrant abd/chest pain. Patient was admitted to rule out ACS. Troponins with mild elevation and EKG showed A. fib with right bundle branch block and no ST segment changes. Cardiology was consulted and recommended nuclear stress test. It seems that he has had atrial fibrillation diagnosed previously but had been unreliable as far as taking his Coumadin and had difficulty maintaining a therapeutic INR according to his nephew. He was restarted on warfarin given ESRD but refused Lovenox overlap. Nuclear stress test was abnormal but not felt to be high risk and echo showed EF ~45-50%, mod AR, mod MR, severe LAE, mild RV dilation, mild RAE, mod TR, mod PR, PA pressure 43. He was discharged home with outpatient follow up.    Today he presents to clinic for follow up.      Past Medical History:  Diagnosis Date  . AAA (abdominal aortic aneurysm) ([place]) 2009   Ruptured AAA  . Arthritis   . Cancer [facility])    Prostate cancer  . Chronic kidney disease   . Claudication ([place])   . Hemodialysis patient ([place])   . History of thromboembolism   . Hyperlipidemia   . Hypertension     Past Surgical History:  Procedure Laterality Date  . ABDOMINAL AORTIC ANEURYSM REPAIR  03/30/2008   Ruptured AAA  . AV FISTULA PLACEMENT  04/23/11   Left arm revision AVG   . THROMBECTOMY / ARTERIOVENOUS GRAFT REVISION     Numerous revision/ declots of Left Forearm  AVG    Current Medications: Outpatient Medications Prior to Visit  Medication Sig Dispense Refill  . cinacalcet (SENSIPAR) 90 MG tablet Take 90 mg by mouth daily.    . ondansetron (ZOFRAN) 4 MG tablet Take 1 tablet (4 mg total) by mouth every 8 (eight) hours as needed for nausea or vomiting. 20 tablet 0  . pantoprazole (PROTONIX) 40 MG tablet Take 1 tablet (40 mg total) by mouth daily. 30 tablet 0  . warfarin (COUMADIN) 2 MG tablet Take 1 tablet (2 mg total) by mouth daily. 10 tablet 0   No facility-administered medications prior to visit.      Allergies:   Erythromycin   Social History   Social History  . Marital status: Divorced    Spouse name: N/A  . Number of children: N/A  . Years of education: N/A   Social History Main Topics  . Smoking status: Former Smoker    Quit date: 10/01/1976  . Smokeless tobacco: Never Used  . Alcohol use Yes  . Drug use: No  . Sexual activity: Not on file   Other Topics Concern  . Not on file   Social History Narrative  . No narrative on file     Family History:  The patient's ***family history includes Heart attack in his father.     ROS:   Please see the history of present illness.  ROS All other systems reviewed and are negative.   PHYSICAL EXAM:   VS:  There were no vitals taken for this visit.   GEN: Well nourished, well developed, in no acute distress  HEENT: normal  Neck: no JVD, carotid bruits, or masses Cardiac: ***RRR; no murmurs, rubs, or gallops,no edema  Respiratory:  clear to auscultation bilaterally, normal work of breathing GI: soft, nontender, nondistended, + BS MS: no deformity or atrophy  Skin: warm and dry, no rash Neuro:  Alert and Oriented x 3, Strength and sensation are intact Psych: euthymic mood, full affect  Wt Readings from Last 3 Encounters:  06/19/16 157 lb 10.1 oz (71.5 kg)  05/19/12 193 lb 8 oz (87.8 kg)  05/21/11 189 lb (85.7 kg)      Studies/Labs Reviewed:   EKG:  EKG is*** ordered  today.  The ekg ordered today demonstrates ***  Recent Labs: 06/17/2016: ALT 11; Magnesium 2.1; TSH 7.605 06/19/2016: BUN 46; Creatinine, Ser 5.86; Hemoglobin 10.4; Platelets 227; Potassium 4.2; Sodium 136   Lipid Panel No results found for: CHOL, TRIG, HDL, CHOLHDL, VLDL, LDLCALC, LDLDIRECT  Additional studies/ records that were reviewed today include:  Myoview 06/18/16 Study Result    There was no ST segment deviation noted during stress.  Defect 1: There is a small defect of mild severity present in the basal inferior and mid inferior location.  Findings consistent with ischemia.  This is an intermediate risk study.  The left ventricular ejection fraction is mildly decreased (45% based on the today's echo study).   There is small area mild severity reversible defect in the basal and mid inferior wall (SDS =4).    2D ECHO: 06/18/2016 LV EF: 45% -   50% Study Conclusions - Left ventricle: The cavity size was normal. There was moderate   concentric hypertrophy. Systolic function was mildly reduced. The   estimated ejection fraction was in the range of 45% to 50%. Wall   motion was normal; there were no regional wall motion   abnormalities. The study was not technically sufficient to allow   evaluation of LV diastolic dysfunction due to atrial   fibrillation. - Aortic valve: Trileaflet; moderately thickened, moderately   calcified leaflets. There was moderate regurgitation. - Mitral valve: There was moderate regurgitation directed   eccentrically and posteriorly. - Left atrium: The atrium was severely dilated. - Right ventricle: The cavity size was mildly dilated. Wall   thickness was normal. - Right atrium: The atrium was mildly dilated. - Tricuspid valve: There was moderate regurgitation. - Pulmonic valve: There was moderate regurgitation. - Pulmonary arteries: PA peak pressure: 43 mm Hg (S). Impressions: - The right ventricular systolic pressure was increased  consistent   with moderate pulmonary hypertension.    ASSESSMENT & PLAN:  [name] is a 82 y.o. male with a history of AAA repair w/ stent graft, significant PAD, HTN, ESRD on HD,  PAF who presents to clinic for post hospital follow up.   Atypical chest pain: nuclear stress test showed a small defect of mild severity present in the basal inferior and mid inferior location. Dr. Marlou Porch did not feel like this was high risk and recommended discharge home.  Atrial fibrillation: now on coumadin, followed by *** .   ESRD on HD: continue follow up with nephrology  Peripheral vascular disease/aortic atherosclerosis: followed by Dr. Trula Slade   Status post abdominal aortic aneurysm repair  HTN: BP well controlled on current regimen  Medication Adjustments/Labs and Tests Ordered: Current medicines  are reviewed at length with the patient today.  Concerns regarding medicines are outlined above.  Medication changes, Labs and Tests ordered today are listed in the Patient Instructions below. There are no Patient Instructions on file for this visit.   Signed, Angelena Form, PA-C  07/03/2016 1:14 PM    [facility] [place], [place], [place]  [address] Phone: [phone]; Fax: [phone]

## 2016-07-04 ENCOUNTER — Ambulatory Visit: Payer: Medicare Other | Admitting: Physician Assistant

## 2016-07-16 ENCOUNTER — Encounter: Payer: Self-pay | Admitting: Physician Assistant

## 2016-11-29 DEATH — deceased

## 2017-10-09 IMAGING — DX DG CHEST 2V
2 series · 2 of 2 positions shown · non-contrast
Comparison: 10/25/2015 chest radiograph.

CLINICAL DATA: Chest pain

EXAM:
CHEST  2 VIEW

[w chest pa]
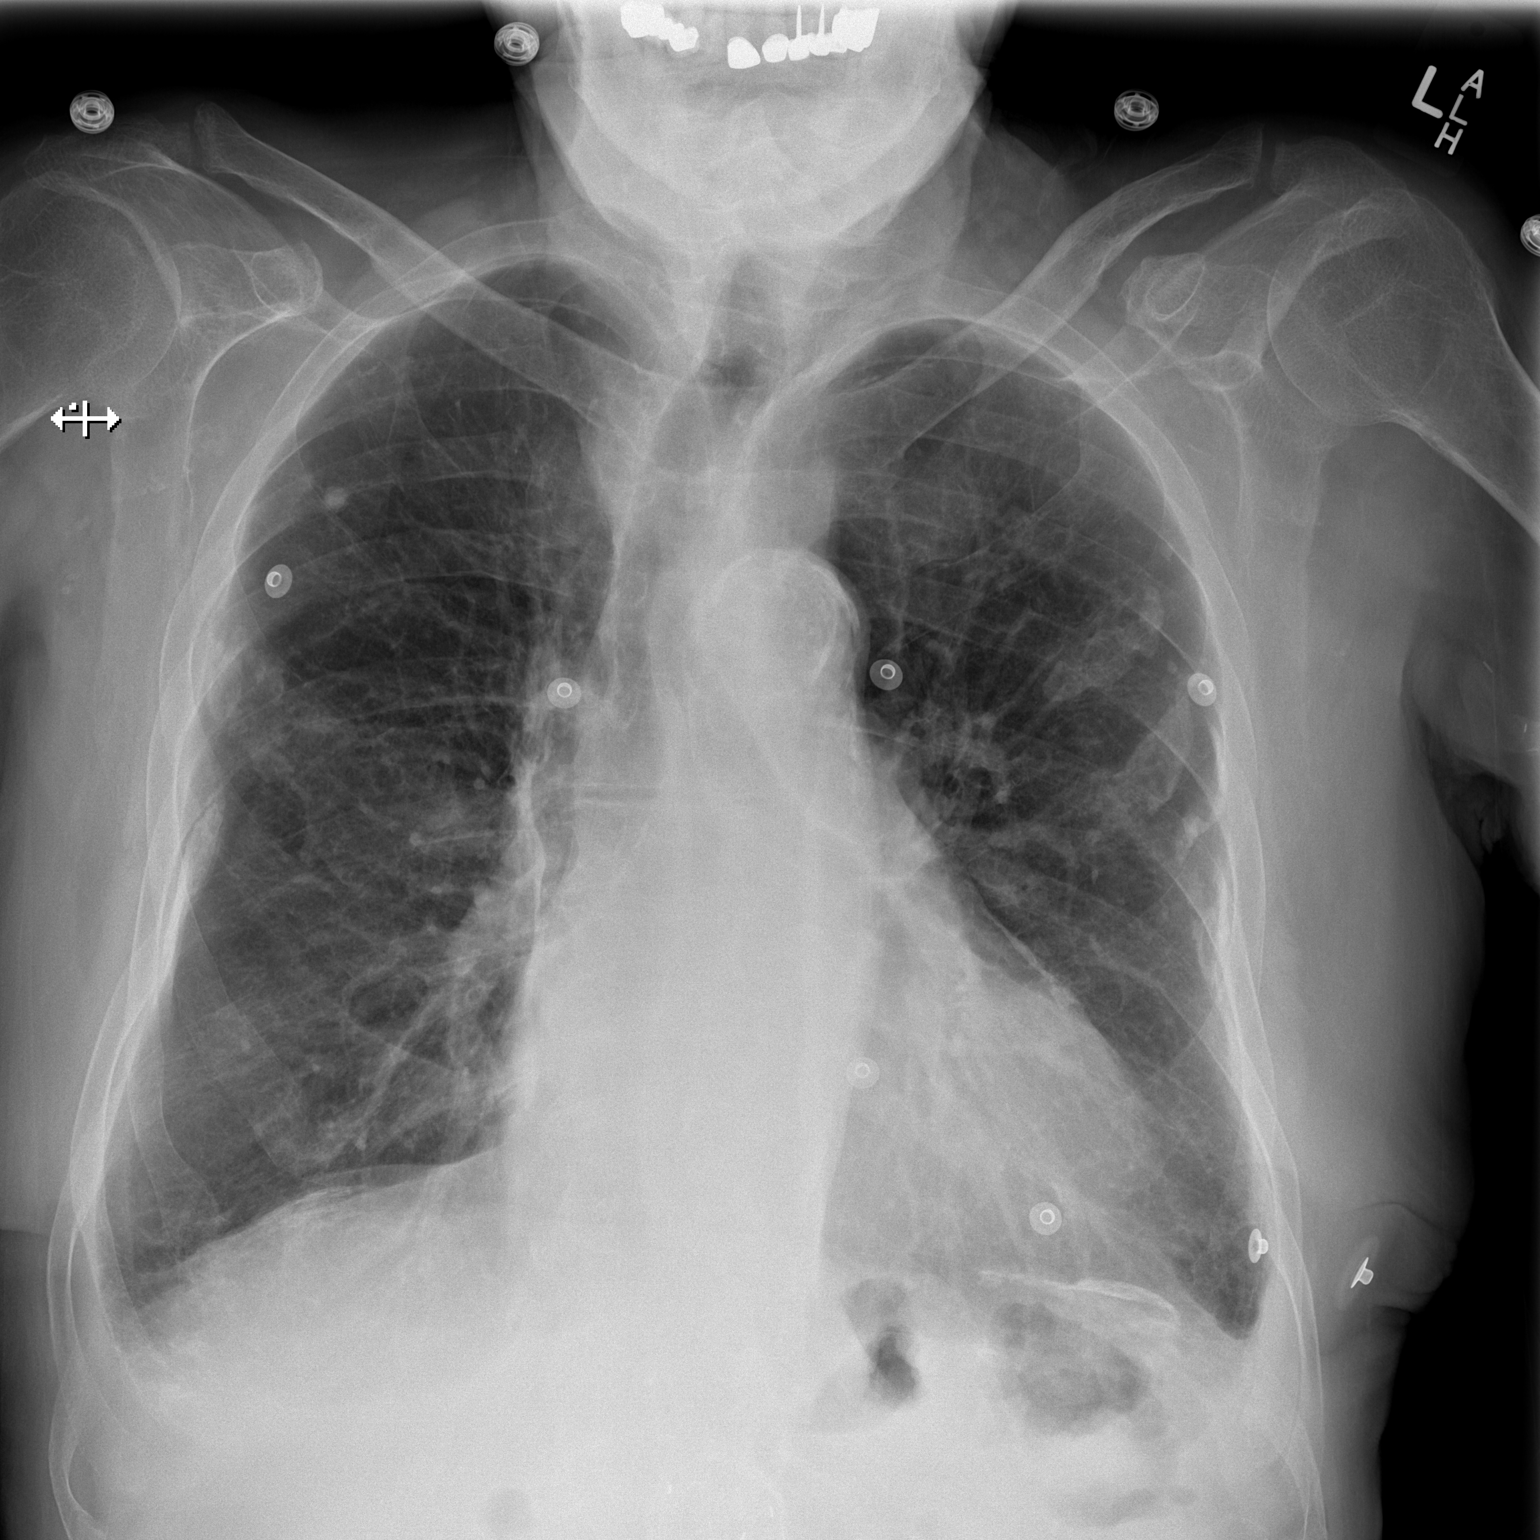

[w chest lat]
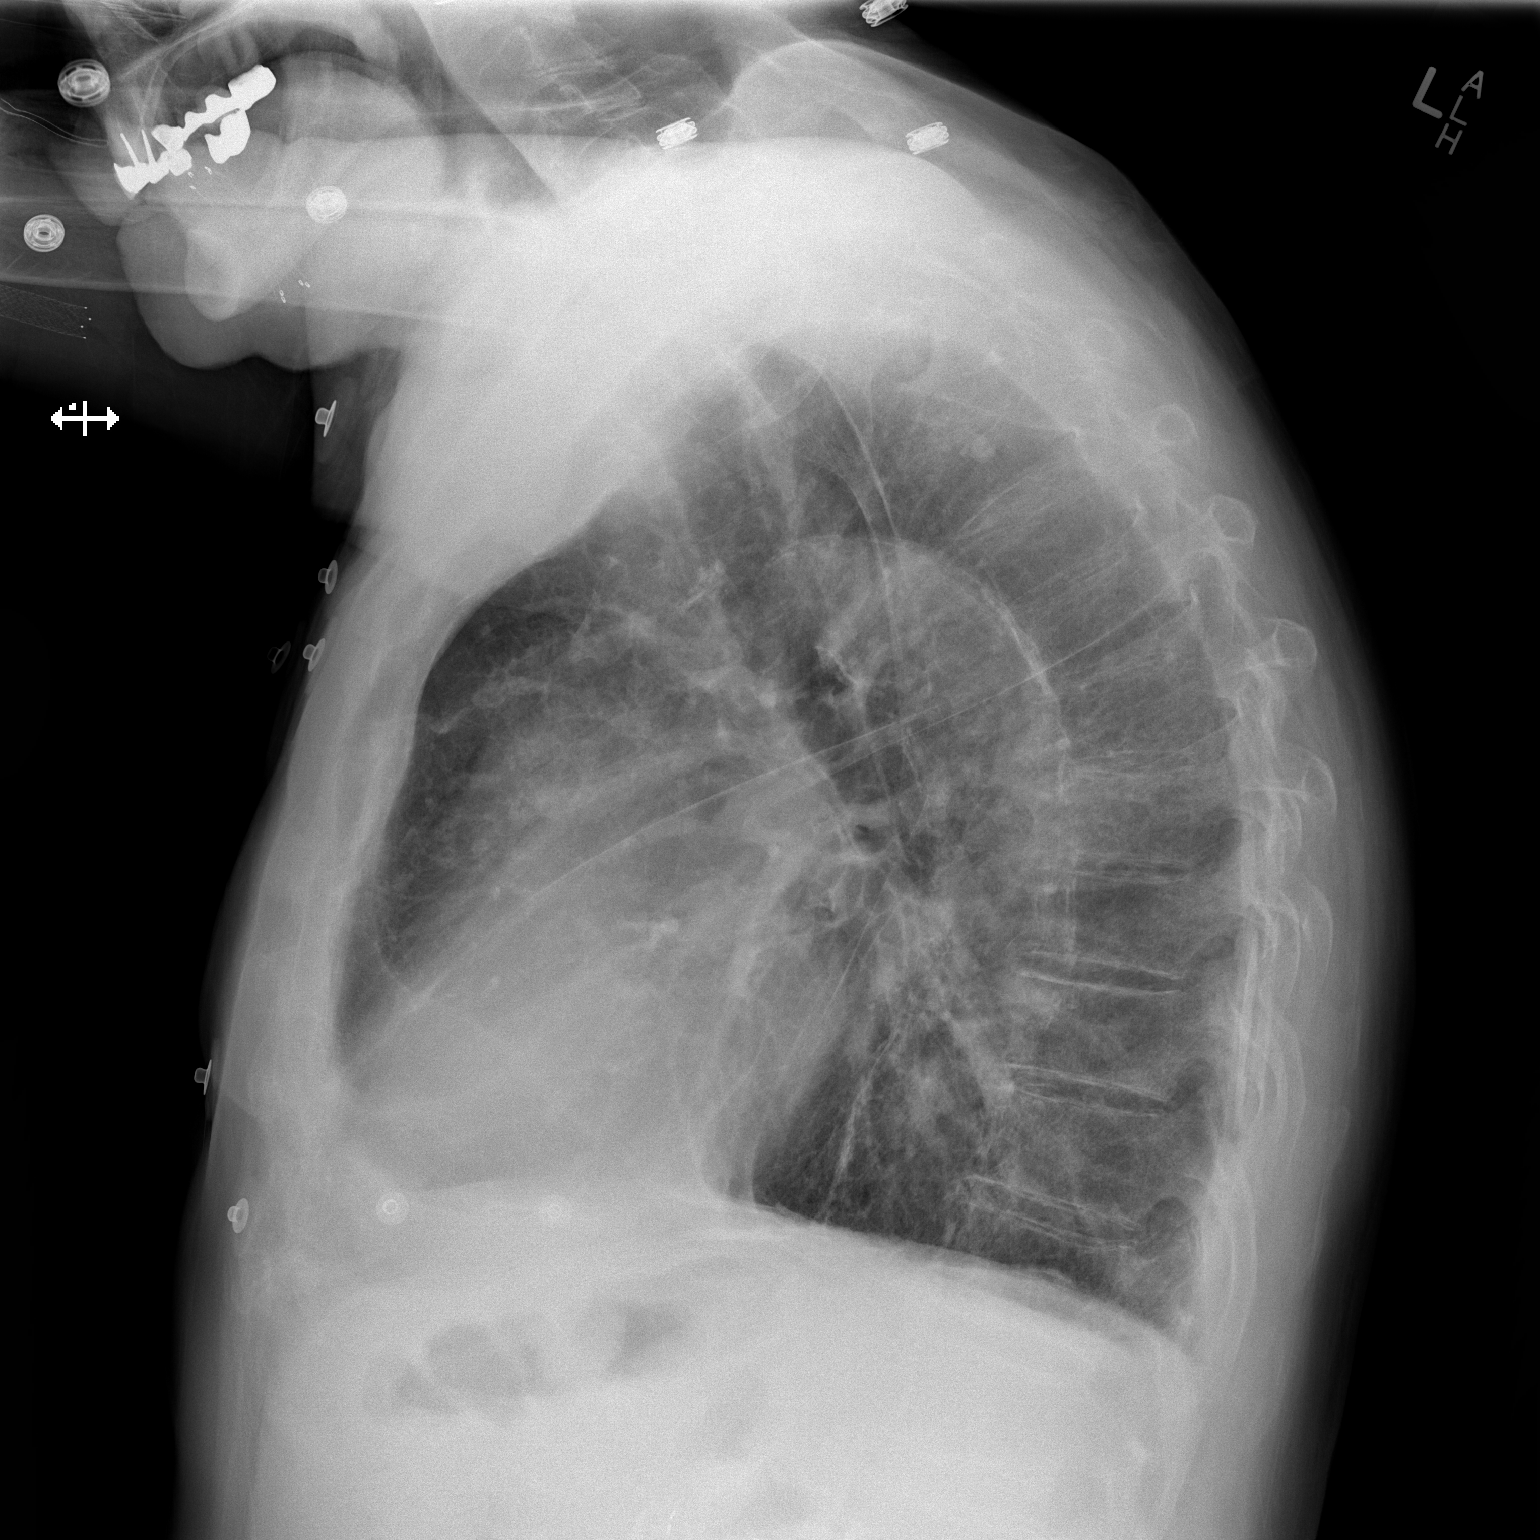

[2 of 2 positions shown; findings below may reference images not displayed]

FINDINGS: Stable cardiomediastinal silhouette with normal heart size and
aortic atherosclerosis. No pneumothorax. No pleural effusion.
Hyperinflated lungs. Emphysema. Calcified pleural plaques
bilaterally appear unchanged. No acute consolidative airspace
disease. No pulmonary edema.
IMPRESSION: 1. No acute cardiopulmonary disease.
2. Hyperinflated lungs and emphysema, suggesting COPD.
3. Calcified pleural plaques bilaterally, probably asbestos related.
4. Aortic atherosclerosis.
